# Patient Record
Sex: Female | Born: 1945 | Race: Black or African American | Hispanic: No | Smoking: Former smoker
Health system: Southern US, Community
[De-identification: ages and names within clinical notes are randomized; demographics above are authoritative.]

## PROBLEM LIST (undated history)

## (undated) DIAGNOSIS — I10 Essential (primary) hypertension: Secondary | ICD-10-CM

## (undated) HISTORY — PX: ABDOMINAL HYSTERECTOMY: SHX81

## (undated) HISTORY — PX: TOTAL ABDOMINAL HYSTERECTOMY W/ BILATERAL SALPINGOOPHORECTOMY: SHX83

---

## 2005-07-12 ENCOUNTER — Emergency Department: Payer: Self-pay | Admitting: Emergency Medicine

## 2005-07-12 ENCOUNTER — Other Ambulatory Visit: Payer: Self-pay

## 2007-12-27 ENCOUNTER — Emergency Department: Payer: Self-pay | Admitting: Emergency Medicine

## 2008-03-31 ENCOUNTER — Ambulatory Visit: Payer: Self-pay

## 2008-05-26 ENCOUNTER — Ambulatory Visit: Payer: Self-pay

## 2008-09-29 ENCOUNTER — Ambulatory Visit: Payer: Self-pay

## 2009-04-01 ENCOUNTER — Ambulatory Visit: Payer: Self-pay | Admitting: Family Medicine

## 2009-08-25 ENCOUNTER — Emergency Department: Payer: Self-pay | Admitting: Emergency Medicine

## 2010-06-01 ENCOUNTER — Ambulatory Visit: Payer: Self-pay

## 2011-06-21 ENCOUNTER — Ambulatory Visit: Payer: Self-pay

## 2012-07-02 ENCOUNTER — Ambulatory Visit: Payer: Self-pay

## 2012-07-26 ENCOUNTER — Ambulatory Visit: Payer: Self-pay | Admitting: Family Medicine

## 2013-07-14 ENCOUNTER — Emergency Department: Payer: Self-pay | Admitting: Emergency Medicine

## 2013-07-14 LAB — COMPREHENSIVE METABOLIC PANEL
ALK PHOS: 77 U/L
ANION GAP: 7 (ref 7–16)
AST: 18 U/L (ref 15–37)
Albumin: 3.6 g/dL (ref 3.4–5.0)
BILIRUBIN TOTAL: 0.4 mg/dL (ref 0.2–1.0)
BUN: 15 mg/dL (ref 7–18)
Calcium, Total: 9 mg/dL (ref 8.5–10.1)
Chloride: 107 mmol/L (ref 98–107)
Co2: 27 mmol/L (ref 21–32)
Creatinine: 0.99 mg/dL (ref 0.60–1.30)
EGFR (African American): >60
EGFR (Non-African Amer.): 60 — ABNORMAL LOW
Glucose: 123 mg/dL — ABNORMAL HIGH (ref 65–99)
Osmolality: 283 (ref 275–301)
Potassium: 3.3 mmol/L — ABNORMAL LOW (ref 3.5–5.1)
SGPT (ALT): 19 U/L (ref 12–78)
Sodium: 141 mmol/L (ref 136–145)
TOTAL PROTEIN: 7.8 g/dL (ref 6.4–8.2)

## 2013-07-14 LAB — TROPONIN I: Troponin-I: <0.02 ng/mL

## 2013-07-14 LAB — CBC
HCT: 38.3 % (ref 35.0–47.0)
HGB: 12.4 g/dL (ref 12.0–16.0)
MCH: 28.1 pg (ref 26.0–34.0)
MCHC: 32.4 g/dL (ref 32.0–36.0)
MCV: 87 fL (ref 80–100)
Platelet: 164 10*3/uL (ref 150–440)
RBC: 4.4 10*6/uL (ref 3.80–5.20)
RDW: 12.9 % (ref 11.5–14.5)
WBC: 9.2 10*3/uL (ref 3.6–11.0)

## 2013-07-14 LAB — CK TOTAL AND CKMB (NOT AT ARMC)
CK, TOTAL: 125 U/L
CK-MB: 0.6 ng/mL (ref 0.5–3.6)

## 2013-07-14 LAB — TSH: Thyroid Stimulating Horm: 1.44 u[IU]/mL

## 2013-07-23 ENCOUNTER — Ambulatory Visit: Payer: Self-pay | Admitting: Internal Medicine

## 2013-08-29 ENCOUNTER — Ambulatory Visit: Payer: Self-pay | Admitting: Gastroenterology

## 2013-10-16 ENCOUNTER — Ambulatory Visit: Payer: Self-pay | Admitting: Internal Medicine

## 2013-10-21 ENCOUNTER — Ambulatory Visit: Payer: Self-pay | Admitting: Internal Medicine

## 2014-04-29 ENCOUNTER — Ambulatory Visit: Payer: Self-pay | Admitting: Internal Medicine

## 2014-12-08 ENCOUNTER — Other Ambulatory Visit: Payer: Self-pay | Admitting: Internal Medicine

## 2014-12-08 DIAGNOSIS — R928 Other abnormal and inconclusive findings on diagnostic imaging of breast: Secondary | ICD-10-CM

## 2014-12-18 ENCOUNTER — Ambulatory Visit: Payer: Self-pay

## 2014-12-18 ENCOUNTER — Other Ambulatory Visit: Payer: Self-pay | Admitting: Internal Medicine

## 2014-12-18 ENCOUNTER — Ambulatory Visit
Admission: RE | Admit: 2014-12-18 | Discharge: 2014-12-18 | Disposition: A | Discharge status: Home / Self Care | Payer: Medicare Other | Source: Ambulatory Visit | Attending: Internal Medicine | Admitting: Internal Medicine

## 2014-12-18 ENCOUNTER — Ambulatory Visit: Payer: Medicare Other

## 2014-12-18 DIAGNOSIS — R921 Mammographic calcification found on diagnostic imaging of breast: Secondary | ICD-10-CM | POA: Insufficient documentation

## 2014-12-18 DIAGNOSIS — R928 Other abnormal and inconclusive findings on diagnostic imaging of breast: Secondary | ICD-10-CM

## 2015-12-16 ENCOUNTER — Other Ambulatory Visit: Payer: Self-pay | Admitting: Family Medicine

## 2015-12-16 DIAGNOSIS — R928 Other abnormal and inconclusive findings on diagnostic imaging of breast: Secondary | ICD-10-CM

## 2016-01-07 ENCOUNTER — Ambulatory Visit
Admission: RE | Admit: 2016-01-07 | Discharge: 2016-01-07 | Disposition: A | Discharge status: Home / Self Care | Payer: Medicare Other | Source: Ambulatory Visit | Attending: Family Medicine | Admitting: Family Medicine

## 2016-01-07 DIAGNOSIS — R921 Mammographic calcification found on diagnostic imaging of breast: Secondary | ICD-10-CM | POA: Insufficient documentation

## 2016-01-07 DIAGNOSIS — R928 Other abnormal and inconclusive findings on diagnostic imaging of breast: Secondary | ICD-10-CM

## 2016-05-17 ENCOUNTER — Other Ambulatory Visit: Payer: Self-pay | Admitting: Family Medicine

## 2016-05-17 DIAGNOSIS — Z0000 Encounter for general adult medical examination without abnormal findings: Secondary | ICD-10-CM

## 2017-01-13 ENCOUNTER — Emergency Department
Admission: EM | Admit: 2017-01-13 | Discharge: 2017-01-13 | Disposition: A | Discharge status: Home / Self Care | Payer: Medicare Other | Attending: Emergency Medicine | Admitting: Emergency Medicine

## 2017-01-13 ENCOUNTER — Encounter: Payer: Self-pay | Admitting: Emergency Medicine

## 2017-01-13 ENCOUNTER — Emergency Department: Payer: Medicare Other

## 2017-01-13 DIAGNOSIS — I10 Essential (primary) hypertension: Secondary | ICD-10-CM | POA: Diagnosis not present

## 2017-01-13 DIAGNOSIS — Z87891 Personal history of nicotine dependence: Secondary | ICD-10-CM | POA: Diagnosis not present

## 2017-01-13 DIAGNOSIS — R079 Chest pain, unspecified: Secondary | ICD-10-CM | POA: Insufficient documentation

## 2017-01-13 HISTORY — DX: Essential (primary) hypertension: I10

## 2017-01-13 LAB — COMPREHENSIVE METABOLIC PANEL
ALK PHOS: 76 U/L (ref 38–126)
ALT: 12 U/L — ABNORMAL LOW (ref 14–54)
AST: 20 U/L (ref 15–41)
Albumin: 3.7 g/dL (ref 3.5–5.0)
Anion gap: 7 (ref 5–15)
BILIRUBIN TOTAL: 0.6 mg/dL (ref 0.3–1.2)
BUN: 13 mg/dL (ref 6–20)
CALCIUM: 8.9 mg/dL (ref 8.9–10.3)
CO2: 27 mmol/L (ref 22–32)
Chloride: 102 mmol/L (ref 101–111)
Creatinine, Ser: 0.76 mg/dL (ref 0.44–1.00)
GFR calc Af Amer: >60 mL/min (ref >60)
GFR calc non Af Amer: >60 mL/min (ref >60)
Glucose, Bld: 120 mg/dL — ABNORMAL HIGH (ref 65–99)
Potassium: 3.6 mmol/L (ref 3.5–5.1)
Sodium: 136 mmol/L (ref 135–145)
TOTAL PROTEIN: 7.8 g/dL (ref 6.5–8.1)

## 2017-01-13 LAB — CBC
HCT: 40.4 % (ref 35.0–47.0)
Hemoglobin: 14 g/dL (ref 12.0–16.0)
MCH: 29.7 pg (ref 26.0–34.0)
MCHC: 34.7 g/dL (ref 32.0–36.0)
MCV: 85.6 fL (ref 80.0–100.0)
Platelets: 196 10*3/uL (ref 150–440)
RBC: 4.72 MIL/uL (ref 3.80–5.20)
RDW: 13 % (ref 11.5–14.5)
WBC: 8.6 10*3/uL (ref 3.6–11.0)

## 2017-01-13 LAB — TROPONIN I: Troponin I: <0.03 ng/mL (ref <0.03)

## 2017-01-13 NOTE — ED Notes (Signed)
Introduced myself to the pt after taking report from [place]. Pt is currently pain free and awaiting lab results. Sitting comfortably in bed watching tv in NAD

## 2017-01-13 NOTE — Discharge Instructions (Signed)
You have been seen in the emergency department today for chest pain. Your workup has shown normal results. As we discussed please follow-up with your primary care physician in the next 1-2 days for recheck. Return to the emergency department for any further chest pain, trouble breathing, or any other symptom personally concerning to yourself. °

## 2017-01-13 NOTE — ED Notes (Signed)
Informed patient that the next doctor that comes in 69 would be seeing her. Informed her that blood results and X results should be back soon. Pt agreeable with plan. Given TV remote.

## 2017-01-13 NOTE — ED Provider Notes (Signed)
[facility] Emergency Department Provider Note  Time seen: 11:08 AM  I have reviewed the triage vital signs and the nursing notes.   HISTORY  Chief Complaint Chest Pain    HPI TIFFONY KITE is a 71 y.o. female with a past medical history of hypertension who presents to the emergency department for chest pain. According to the patient she awoke this morning feeling fine. She ate breakfast and developed a discomfort in the chest.patient states it felt like indigestion so she did not pay much attention to it. She went to work states the chest pain had went away but she was having a strange sensation like her blood pressure was up or like her sugar was up so she came to the emergency department. Patient denies any symptoms at this time. States she is feeling normal currently. Denies any further chest discomfort. Denies any shortness of breath nausea or diaphoresis at any time. No leg pain or swelling.  Past Medical History:  Diagnosis Date  . Hypertension     There are no active problems to display for this patient.   Past Surgical History:  Procedure Laterality Date  . ABDOMINAL HYSTERECTOMY      Prior to Admission medications   Not on File    Allergies  Allergen Reactions  . Aspirin Other (See Comments)    Heart flutter    Family History  Problem Relation Age of Onset  . Breast cancer Maternal Aunt 70    Social History Social History  Substance Use Topics  . Smoking status: Former Research scientist (life sciences)  . Smokeless tobacco: Not on file  . Alcohol use Not on file    Review of Systems Constitutional: Negative for fever Cardiovascular: positive for chest discomfort, now resolved. Respiratory: Negative for shortness of breath. Gastrointestinal: Negative for abdominal pain, vomiting and diarrhea. Genitourinary: Negative for dysuria. Musculoskeletal: negative for leg pain or swelling. Neurological: Negative for headache All other ROS  negative  ____________________________________________   PHYSICAL EXAM:  VITAL SIGNS: ED Triage Vitals  Enc Vitals Group     BP 01/13/17 1022 (!) 170/90     Pulse Rate 01/13/17 1022 99     Resp 01/13/17 1022 20     Temp 01/13/17 1022 98.8 F (37.1 C)     Temp Source 01/13/17 1022 Oral     SpO2 01/13/17 1022 100 %     Weight 01/13/17 1025 118 lb (53.5 kg)     Height 01/13/17 1025 5\' 4"  (1.626 m)     Head Circumference --      Peak Flow --      Pain Score 01/13/17 1050 0     Pain Loc --      Pain Edu? --      Excl. in [place]? --     Constitutional: Alert and oriented. Well appearing and in no distress. Eyes: Normal exam ENT   Head: Normocephalic and atraumatic   Mouth/Throat: Mucous membranes are moist. Cardiovascular: Normal rate, regular rhythm. No murmur Respiratory: Normal respiratory effort without tachypnea nor retractions. Breath sounds are clear Gastrointestinal: Soft and nontender. No distention. Musculoskeletal: Nontender with normal range of motion in all extremities. no leg pain or swelling. Neurologic:  Normal speech and language. No gross focal neurologic deficits Skin:  Skin is warm, dry and intact.  Psychiatric: Mood and affect are normal.   ____________________________________________    EKG  EKG reviewed and interpreted by myself shows normal sinus rhythm at 94 bpm, narrow QRS, normal axis, normal  intervals, non-concerning ST changes.  ____________________________________________    RADIOLOGY  chest x-ray negative  ____________________________________________   INITIAL IMPRESSION / ASSESSMENT AND PLAN / ED COURSE  Pertinent labs & imaging results that were available during my care of the patient were reviewed by me and considered in my medical decision making (see chart for details).  patient presents to the emergency department for chest discomfort this morning after eating. Patient states it felt like indigestion, but later at work she  had a strange sensation like her blood pressure might be up or like her sugar might be up so she decided to come to the emergency department for evaluation. Denies any chest discomfort currently. Denies any shortness of breath nausea or diaphoresis at any point. Physical exam is nonrevealing, no lower extremity pain or edema. Normal heart sounds and clear lung sounds. Patient's EKG is reassuring, chest x-ray is negative. Labs are pending at this time.  patient's labs are normal including negative troponin. Patient continues to appear very well with no complaints in the emergency department. We will discharge home with my normal chest pain return precautions and PCP follow-up. Patient agreeable to plan.  ____________________________________________   FINAL CLINICAL IMPRESSION(S) / ED DIAGNOSES  chest pain    Harvest Dark, MD 01/13/17 1149

## 2017-01-13 NOTE — ED Notes (Addendum)
Report to Susan RN.

## 2017-01-13 NOTE — ED Triage Notes (Signed)
Woke with chest pain this am, resolved. Returned while she was at work, pain free at present.

## 2017-02-12 ENCOUNTER — Other Ambulatory Visit: Payer: Self-pay | Admitting: Family Medicine

## 2017-02-12 DIAGNOSIS — Z1231 Encounter for screening mammogram for malignant neoplasm of breast: Secondary | ICD-10-CM

## 2017-03-01 ENCOUNTER — Ambulatory Visit
Admission: RE | Admit: 2017-03-01 | Discharge: 2017-03-01 | Disposition: A | Discharge status: Home / Self Care | Payer: Medicare Other | Source: Ambulatory Visit | Attending: Family Medicine | Admitting: Family Medicine

## 2017-03-01 DIAGNOSIS — Z1231 Encounter for screening mammogram for malignant neoplasm of breast: Secondary | ICD-10-CM | POA: Insufficient documentation

## 2018-02-13 ENCOUNTER — Emergency Department
Admission: EM | Admit: 2018-02-13 | Discharge: 2018-02-14 | Disposition: A | Discharge status: Home / Self Care | Payer: Medicare Other | Attending: Emergency Medicine | Admitting: Emergency Medicine

## 2018-02-13 ENCOUNTER — Other Ambulatory Visit: Payer: Self-pay

## 2018-02-13 ENCOUNTER — Encounter: Payer: Self-pay | Admitting: Emergency Medicine

## 2018-02-13 ENCOUNTER — Emergency Department: Payer: Medicare Other

## 2018-02-13 DIAGNOSIS — Z87891 Personal history of nicotine dependence: Secondary | ICD-10-CM | POA: Diagnosis not present

## 2018-02-13 DIAGNOSIS — M545 Low back pain, unspecified: Secondary | ICD-10-CM

## 2018-02-13 DIAGNOSIS — I10 Essential (primary) hypertension: Secondary | ICD-10-CM | POA: Insufficient documentation

## 2018-02-13 DIAGNOSIS — R1031 Right lower quadrant pain: Secondary | ICD-10-CM

## 2018-02-13 DIAGNOSIS — R109 Unspecified abdominal pain: Secondary | ICD-10-CM

## 2018-02-13 DIAGNOSIS — N390 Urinary tract infection, site not specified: Secondary | ICD-10-CM | POA: Diagnosis not present

## 2018-02-13 LAB — CBC
HCT: 36.3 % (ref 35.0–47.0)
Hemoglobin: 13.1 g/dL (ref 12.0–16.0)
MCH: 31.6 pg (ref 26.0–34.0)
MCHC: 36 g/dL (ref 32.0–36.0)
MCV: 87.7 fL (ref 80.0–100.0)
PLATELETS: 185 10*3/uL (ref 150–440)
RBC: 4.14 MIL/uL (ref 3.80–5.20)
RDW: 13 % (ref 11.5–14.5)
WBC: 6.4 10*3/uL (ref 3.6–11.0)

## 2018-02-13 LAB — URINALYSIS, COMPLETE (UACMP) WITH MICROSCOPIC
BILIRUBIN URINE: NEGATIVE
Bacteria, UA: NONE SEEN
Glucose, UA: NEGATIVE mg/dL
HGB URINE DIPSTICK: NEGATIVE
Ketones, ur: NEGATIVE mg/dL
Nitrite: NEGATIVE
Protein, ur: NEGATIVE mg/dL
Specific Gravity, Urine: 1.005 (ref 1.005–1.030)
pH: 6 (ref 5.0–8.0)

## 2018-02-13 LAB — COMPREHENSIVE METABOLIC PANEL
ALK PHOS: 61 U/L (ref 38–126)
ALT: 11 U/L (ref 0–44)
ANION GAP: 7 (ref 5–15)
AST: 17 U/L (ref 15–41)
Albumin: 4 g/dL (ref 3.5–5.0)
BUN: 11 mg/dL (ref 8–23)
CHLORIDE: 104 mmol/L (ref 98–111)
CO2: 29 mmol/L (ref 22–32)
Calcium: 8.7 mg/dL — ABNORMAL LOW (ref 8.9–10.3)
Creatinine, Ser: 0.69 mg/dL (ref 0.44–1.00)
GFR calc Af Amer: >60 mL/min (ref >60)
GFR calc non Af Amer: >60 mL/min (ref >60)
Glucose, Bld: 125 mg/dL — ABNORMAL HIGH (ref 70–99)
Potassium: 3.4 mmol/L — ABNORMAL LOW (ref 3.5–5.1)
Sodium: 140 mmol/L (ref 135–145)
Total Bilirubin: 0.7 mg/dL (ref 0.3–1.2)
Total Protein: 7.2 g/dL (ref 6.5–8.1)

## 2018-02-13 NOTE — ED Provider Notes (Signed)
[facility] Emergency Department Provider Note   ____________________________________________   First MD Initiated Contact with Patient 02/13/18 2301     (approximate)  I have reviewed the triage vital signs and the nursing notes.   HISTORY  Chief Complaint Flank Pain    HPI Patricia Bolton is a 72 y.o. female who presents to the ED from home with a chief complaint of right flank pain.  Patient reports symptoms for 1 week.  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  Now pain is radiating to her right lower quadrant.  Denies associated fever, chills, chest pain, shortness of breath, nausea, vomiting, dysuria.  Denies recent travel or trauma.   Past Medical History:  Diagnosis Date  . Hypertension     There are no active problems to display for this patient.   Past Surgical History:  Procedure Laterality Date  . ABDOMINAL HYSTERECTOMY      Prior to Admission medications   Medication Sig Start Date End Date Taking? Authorizing Provider  cyclobenzaprine (FLEXERIL) 5 MG tablet 1 tablet every 8 hours as needed for muscle spasms 02/14/18   Paulette Blanch, MD  HYDROcodone-acetaminophen (NORCO) 5-325 MG tablet Take 1 tablet by mouth every 6 (six) hours as needed for moderate pain. 02/14/18   Paulette Blanch, MD    Allergies Aspirin  Family History  Problem Relation Age of Onset  . Breast cancer Maternal Aunt 70    Social History Social History   Tobacco Use  . Smoking status: Former Research scientist (life sciences)  . Smokeless tobacco: Never Used  Substance Use Topics  . Alcohol use: Never    Frequency: Never  . Drug use: Never    Review of Systems  Constitutional: No fever/chills Eyes: No visual changes. ENT: No sore throat. Cardiovascular: Denies chest pain. Respiratory: Denies shortness of breath. Gastrointestinal: Positive for right flank and abdominal pain.  No nausea, no vomiting.  No diarrhea.  No constipation. Genitourinary:  Negative for dysuria. Musculoskeletal: Negative for back pain. Skin: Negative for rash. Neurological: Negative for headaches, focal weakness or numbness.   ____________________________________________   PHYSICAL EXAM:  VITAL SIGNS: ED Triage Vitals [02/13/18 2015]  Enc Vitals Group     BP (!) 163/82     Pulse Rate 86     Resp 18     Temp 98.2 F (36.8 C)     Temp Source Oral     SpO2 100 %     Weight 120 lb (54.4 kg)     Height 5\' 3"  (1.6 m)     Head Circumference      Peak Flow      Pain Score 9     Pain Loc      Pain Edu?      Excl. in [place]?     Constitutional: Alert and oriented. Well appearing and in no acute distress. Eyes: Conjunctivae are normal. PERRL. EOMI. Head: Atraumatic. Nose: No congestion/rhinnorhea. Mouth/Throat: Mucous membranes are moist.  Oropharynx non-erythematous. Neck: No stridor.   Cardiovascular: Normal rate, regular rhythm. Grossly normal heart sounds.  Good peripheral circulation. Respiratory: Normal respiratory effort.  No retractions. Lungs CTAB. Gastrointestinal: Soft and nontender to light or deep palpation. No distention. No abdominal bruits. No CVA tenderness. Musculoskeletal: No spinal tenderness to palpation.  Mild right paralumbar muscle spasms.  No lower extremity tenderness nor edema.  No joint effusions. Neurologic:  Normal speech and language. No gross focal neurologic deficits are appreciated. No gait  instability. Skin:  Skin is warm, dry and intact. No rash noted.  No vesicles. Psychiatric: Mood and affect are normal. Speech and behavior are normal.  ____________________________________________   LABS (all labs ordered are listed, but only abnormal results are displayed)  Labs Reviewed  COMPREHENSIVE METABOLIC PANEL - Abnormal; Notable for the following components:      Result Value   Potassium 3.4 (*)    Glucose, Bld 125 (*)    Calcium 8.7 (*)    All other components within normal limits  URINALYSIS, COMPLETE (UACMP)  WITH MICROSCOPIC - Abnormal; Notable for the following components:   Color, Urine STRAW (*)    APPearance CLEAR (*)    Leukocytes, UA TRACE (*)    All other components within normal limits  CBC   ____________________________________________  EKG  None ____________________________________________  RADIOLOGY  ED MD interpretation: No stones, diverticulosis, likely chronic mesenteric edema with lymphadenopathy  Official radiology report(s): Ct Renal Stone Study  Result Date: 02/13/2018 CLINICAL DATA:  Right flank pain. EXAM: CT ABDOMEN AND PELVIS WITHOUT CONTRAST TECHNIQUE: Multidetector CT imaging of the abdomen and pelvis was performed following the standard protocol without IV contrast. COMPARISON:  None. FINDINGS: Lower chest: The lung bases are clear. Hepatobiliary: Focal hepatic abnormality on noncontrast exam. Gallbladder is nondistended. No calcified gallstone or pericholecystic inflammation. No biliary dilatation. Pancreas: No ductal dilatation or inflammation. Lack of contrast and paucity of intra-abdominal fat partially limits evaluation of the pancreas. Spleen: Subdiaphragmatic capsular calcifications peripherally. Spleen is normal in size. Adrenals/Urinary Tract: Mild bilateral adrenal thickening. No dominant adrenal nodule. No hydronephrosis or perinephric edema. 5 mm calcification in the left kidney felt to be dystrophic, no definite stones in the collecting system. Both ureters are decompressed. Urinary bladder is partially distended, equivocal wall thickening which may be normal for degree of distension. No bladder stone. Stomach/Bowel: Multifocal colonic diverticulosis without diverticulitis. Diverticula most prominent in the distal colon. Possible noninflamed diverticulum in the distal ileum, for example axial image 54 series 2. The appendix is not discretely identified, no secondary findings of appendicitis. Small hiatal hernia and wall thickening of the gastric cardia. No bowel  obstruction, small bowel inflammation or evidence of wall thickening on noncontrast exam. Vascular/Lymphatic: Mild aorta bi-iliac atherosclerosis. Mild central mesenteric edema with adjacent small mesenteric nodes. No bulky adenopathy. Reproductive: Status post hysterectomy. No adnexal masses. Other: Mild central mesenteric edema. No free fluid or free air. No intra-abdominal abscess. Musculoskeletal: There are no acute or suspicious osseous abnormalities. IMPRESSION: 1. No renal stones or obstructive uropathy. Coarse calcification in the left kidney felt to be parenchymal and likely dystrophic. 2. Mild central mesenteric edema with prominent lymph nodes, of uncertain acuity. This is often a chronic finding, however acute mesenteric panniculitis is also considered. No suspicious small-bowel abnormality on noncontrast exam. 3. Diffuse colonic diverticulosis without diverticulitis. Possible mild distal small bowel diverticulosis, also noninflamed. 4.  Aortic Atherosclerosis (ICD10-I70.0). Electronically Signed   By: Keith Rake M.D.   On: 02/13/2018 23:51    ____________________________________________   PROCEDURES  Procedure(s) performed: None  Procedures  Critical Care performed: No  ____________________________________________   INITIAL IMPRESSION / ASSESSMENT AND PLAN / ED COURSE  As part of my medical decision making, I reviewed the following data within the [place] notes reviewed and incorporated, Labs reviewed, Old chart reviewed, Radiograph reviewed  and Notes from prior ED visits   72 year old female who presents with a one-week history of right flank to abdominal pain. Differential diagnosis includes,  but is not limited to, ovarian cyst, ovarian torsion, acute appendicitis, diverticulitis, urinary tract infection/pyelonephritis, endometriosis, bowel obstruction, colitis, renal colic, gastroenteritis, hernia, fibroids, endometriosis, etc.  Endorses  pain currently 1/10.  Laboratory and urinalysis results notable for trace leukocytes.  Patient has no personal history of kidney stones.  Will obtain CT renal colic study to evaluate for stones.  Clinical Course as of Feb 14 30  Thu Feb 14, 2018  0027 Patient resting in no acute distress.  Updated her of CT results.  Will give 1 dose fosfomycin and patient will follow-up with her PCP.  Will discharge home on analgesia and muscle relaxer.  Hold NSAIDs as patient is allergic to aspirin.  Strict return precautions given.  Patient verbalizes understanding and agrees with plan of care.   [JS]    Clinical Course User Index [JS] Paulette Blanch, MD     ____________________________________________   FINAL CLINICAL IMPRESSION(S) / ED DIAGNOSES  Final diagnoses:  Right flank pain  Lumbosacral pain  Lower urinary tract infectious disease  Right lower quadrant abdominal pain     ED Discharge Orders         Ordered    cyclobenzaprine (FLEXERIL) 5 MG tablet     02/14/18 0029    HYDROcodone-acetaminophen (NORCO) 5-325 MG tablet  Every 6 hours PRN     02/14/18 0029           Note:  This document was prepared using Dragon voice recognition software and may include unintentional dictation errors.    Paulette Blanch, MD 02/14/18 [phone]

## 2018-02-13 NOTE — ED Triage Notes (Signed)
Pt presents to ED with right sided back / flank pain for the past week. Pt denies radiating pain or urinary symptoms. Pt denies similar symptoms previously.

## 2018-02-14 MED ORDER — HYDROCODONE-ACETAMINOPHEN 5-325 MG PO TABS: 1.0000 | ORAL_TABLET | Freq: Four times a day (QID) | ORAL | 0 refills | Status: DC | PRN

## 2018-02-14 MED ORDER — CYCLOBENZAPRINE HCL 5 MG PO TABS: ORAL_TABLET | ORAL | 0 refills | Status: DC

## 2018-02-14 MED ORDER — FOSFOMYCIN TROMETHAMINE 3 G PO PACK
3.0000 g | PACK | Freq: Once | ORAL | Status: AC
  Administered 2018-02-14: 3 g via ORAL
  Filled 2018-02-14: qty 3

## 2018-02-14 NOTE — Discharge Instructions (Signed)
1.  You may take medicines as needed for pain and muscle spasms (Norco/Flexeril #15). 2.  Apply moist heat to affected area several times daily. 3.  Return to the ER for worsening symptoms, persistent vomiting, difficulty breathing or other concerns.

## 2018-02-18 ENCOUNTER — Other Ambulatory Visit (HOSPITAL_COMMUNITY): Payer: Self-pay | Admitting: Diagnostic Radiology

## 2018-02-18 ENCOUNTER — Other Ambulatory Visit: Payer: Self-pay | Admitting: Family Medicine

## 2018-02-18 DIAGNOSIS — Z1231 Encounter for screening mammogram for malignant neoplasm of breast: Secondary | ICD-10-CM

## 2018-07-24 ENCOUNTER — Telehealth: Payer: Self-pay | Admitting: *Deleted

## 2018-07-24 NOTE — Telephone Encounter (Signed)
Received referral for low dose lung cancer screening CT scan. Attempted to leave Message at phone number listed in EMR for patient to call me back to facilitate scheduling scan. However, this option is not available. Will attempt to contact at a later date.

## 2018-07-25 ENCOUNTER — Telehealth: Payer: Self-pay | Admitting: *Deleted

## 2018-07-25 NOTE — Telephone Encounter (Signed)
Received referral for low dose lung cancer screening CT scan. Attempted to leave Message at phone number listed in EMR for patient to call me back to facilitate scheduling scan. However, this option is not available. Will attempt to contact at a later date

## 2018-07-26 ENCOUNTER — Telehealth: Payer: Self-pay | Admitting: *Deleted

## 2018-07-26 ENCOUNTER — Encounter: Payer: Self-pay | Admitting: *Deleted

## 2018-07-26 NOTE — Telephone Encounter (Signed)
Continue to be unable to contact patient regarding lung cancer screening referral. Will mail letter.

## 2018-10-03 ENCOUNTER — Telehealth: Payer: Self-pay | Admitting: *Deleted

## 2018-10-03 NOTE — Telephone Encounter (Signed)
Attempted to schedule for lung screening scan, however, there is no answer or voicemail.

## 2018-10-15 ENCOUNTER — Telehealth: Payer: Self-pay | Admitting: *Deleted

## 2018-10-15 ENCOUNTER — Encounter: Payer: Self-pay | Admitting: *Deleted

## 2018-10-15 NOTE — Telephone Encounter (Signed)
Received referral for low dose lung cancer screening CT scan. Message left at phone number listed in EMR for patient to call me back to facilitate scheduling scan.  

## 2018-10-25 ENCOUNTER — Encounter: Payer: Self-pay | Admitting: *Deleted

## 2019-01-25 ENCOUNTER — Emergency Department
Admission: EM | Admit: 2019-01-25 | Discharge: 2019-01-26 | Disposition: A | Discharge status: Home / Self Care | Payer: Medicare Other | Attending: Emergency Medicine | Admitting: Emergency Medicine

## 2019-01-25 ENCOUNTER — Other Ambulatory Visit: Payer: Self-pay

## 2019-01-25 DIAGNOSIS — I10 Essential (primary) hypertension: Secondary | ICD-10-CM | POA: Diagnosis not present

## 2019-01-25 DIAGNOSIS — R002 Palpitations: Secondary | ICD-10-CM | POA: Insufficient documentation

## 2019-01-25 DIAGNOSIS — Z87891 Personal history of nicotine dependence: Secondary | ICD-10-CM | POA: Diagnosis not present

## 2019-01-25 LAB — BASIC METABOLIC PANEL
Anion gap: 8 (ref 5–15)
BUN: 14 mg/dL (ref 8–23)
CO2: 27 mmol/L (ref 22–32)
Calcium: 8.7 mg/dL — ABNORMAL LOW (ref 8.9–10.3)
Chloride: 104 mmol/L (ref 98–111)
Creatinine, Ser: 0.75 mg/dL (ref 0.44–1.00)
GFR calc Af Amer: >60 mL/min (ref >60)
GFR calc non Af Amer: >60 mL/min (ref >60)
Glucose, Bld: 92 mg/dL (ref 70–99)
Potassium: 3.6 mmol/L (ref 3.5–5.1)
Sodium: 139 mmol/L (ref 135–145)

## 2019-01-25 LAB — MAGNESIUM: Magnesium: 2.2 mg/dL (ref 1.7–2.4)

## 2019-01-25 LAB — TSH: TSH: 1.269 u[IU]/mL (ref 0.350–4.500)

## 2019-01-25 LAB — CBC
HCT: 38.1 % (ref 36.0–46.0)
Hemoglobin: 13 g/dL (ref 12.0–15.0)
MCH: 30 pg (ref 26.0–34.0)
MCHC: 34.1 g/dL (ref 30.0–36.0)
MCV: 88 fL (ref 80.0–100.0)
Platelets: 186 10*3/uL (ref 150–400)
RBC: 4.33 MIL/uL (ref 3.87–5.11)
RDW: 12.1 % (ref 11.5–15.5)
WBC: 7.6 10*3/uL (ref 4.0–10.5)
nRBC: 0 % (ref 0.0–0.2)

## 2019-01-25 NOTE — ED Triage Notes (Signed)
Pt comes EMS from home after experiencing some palpitations while getting ready. Pt says her heart was racing. Pt was hypertensive with EMS and 158/94 now. Pt AOx4. Pt was on BP meds a couple of years ago but stopped taking them. 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

## 2019-01-25 NOTE — ED Notes (Signed)
Pt unhooked to use restroom. Pt resting back in bed in NAD. Pt denies any further needs. No reports of palpitations since arrival.

## 2019-01-26 NOTE — ED Notes (Signed)
Pt signed physical discharge form. 

## 2019-01-26 NOTE — ED Provider Notes (Signed)
[facility] Emergency Department Provider Note   ____________________________________________   First MD Initiated Contact with Patient 01/25/19 2131     (approximate)  I have reviewed the triage vital signs and the nursing notes.   HISTORY  Chief Complaint Palpitations    HPI Patricia Bolton is a 73 y.o. female reports history of hypertension and previously told she may have had a "heart arrhythmia"  Patient reports she was at home, she was getting things ready to go to church tomorrow when she noticed her heart started beating fast.  It felt like it was racing for about 30 minutes.  Was not associate any chest pain or shortness of breath.  It just seemed to persist, she comes the ER for further evaluation.  She reports that this has since gone away on its own.  She is in no pain or discomfort.  Denies having had any chest pain.  She reports that she is previously been followed up at her primary doctor as she had a similar episode in the past as well.  No abdominal pain.  No recent illness.  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  Denies any ongoing symptoms at all.  Currently asymptomatic.  She did not feel like she was in a pass out just felt her heart was racing rather quickly for about half hour   Past Medical History:  Diagnosis Date  . Hypertension     There are no active problems to display for this patient.   Past Surgical History:  Procedure Laterality Date  . ABDOMINAL HYSTERECTOMY      Prior to Admission medications   Medication Sig Start Date End Date Taking? Authorizing Provider  cyclobenzaprine (FLEXERIL) 5 MG tablet 1 tablet every 8 hours as needed for muscle spasms 02/14/18   Paulette Blanch, MD  HYDROcodone-acetaminophen (NORCO) 5-325 MG tablet Take 1 tablet by mouth every 6 (six) hours as needed for moderate pain. 02/14/18   Paulette Blanch, MD    Allergies Aspirin  Family History  Problem Relation  Age of Onset  . Breast cancer Maternal Aunt 70    Social History Social History   Tobacco Use  . Smoking status: Former Research scientist (life sciences)  . Smokeless tobacco: Never Used  Substance Use Topics  . Alcohol use: Never    Frequency: Never  . Drug use: Never    Review of Systems Constitutional: No fever/chills Eyes: No visual changes. ENT: No sore throat. Cardiovascular: Denies chest pain.  See HPI respiratory: Denies shortness of breath. Gastrointestinal: No abdominal pain.   Genitourinary: Negative for dysuria. Musculoskeletal: Negative for back pain. Skin: Negative for rash. Neurological: Negative for headaches, areas of focal weakness or numbness.   Of note, patient was documented as possibly having a heart aneurysm, but in discussion with her which he tells me is that she had an arrhythmia not aneurysm ____________________________________________   PHYSICAL EXAM:  VITAL SIGNS: ED Triage Vitals [01/25/19 2052]  Enc Vitals Group     BP (!) 158/94     Pulse Rate 75     Resp 17     Temp 97.9 F (36.6 C)     Temp Source Oral     SpO2 100 %     Weight 118 lb (53.5 kg)     Height 5\' 6"  (1.676 m)     Head Circumference      Peak Flow      Pain Score 0  Pain Loc      Pain Edu?      Excl. in [place]?     Constitutional: Alert and oriented. Well appearing and in no acute distress. Head: Atraumatic. Mouth/Throat: Mucous membranes are moist. Neck: No stridor.  Cardiovascular: Normal rate, regular rhythm. Grossly normal heart sounds.  Good peripheral circulation.  Very reassuring and normal cardiopulmonary exam. Respiratory: Normal respiratory effort.  No retractions. Lungs CTAB. Gastrointestinal: Soft and nontender.  Musculoskeletal: No lower extremity tenderness nor edema. Neurologic:  Normal speech and language. No gross focal neurologic deficits are appreciated.  Skin:  Skin is warm, dry and intact. No rash noted. Psychiatric: Mood and affect are normal.  Very pleasant and  well oriented  ____________________________________________   LABS (all labs ordered are listed, but only abnormal results are displayed)  Labs Reviewed  BASIC METABOLIC PANEL - Abnormal; Notable for the following components:      Result Value   Calcium 8.7 (*)    All other components within normal limits  CBC  MAGNESIUM  TSH   ____________________________________________  EKG  ED ECG REPORT I, Delman Kitten, the attending physician, personally viewed and interpreted this ECG.  Date: 01/26/2019 EKG Time: 2100 Rate: 75 Rhythm: normal sinus rhythm QRS Axis: normal Intervals: normal ST/T Wave abnormalities: normal Narrative Interpretation: no evidence of acute ischemia  ____________________________________________  RADIOLOGY  No indication for imaging denoted.  No acute pulmonary symptoms.  Denies chest pain. ____________________________________________   PROCEDURES  Procedure(s) performed: None  Procedures  Critical Care performed: No  ____________________________________________   INITIAL IMPRESSION / ASSESSMENT AND PLAN / ED COURSE  Pertinent labs & imaging results that were available during my care of the patient were reviewed by me and considered in my medical decision making (see chart for details).   Patient had palpitations, currently resolved.  Reports she has had this once in the past at least.  Symptoms were landing on her own and she did not feel presyncopal or have any chest pain or shortness of breath associated.  She is currently asymptomatic.  EKG and lab work reassuring.  No chest pain, I do not suspect ACS.  Clear lungs and reassuring vitals with moderate hypertension which she reports is no  Lab work reviewed, electrolytes reassuring, slightly low calcium.  TSH reviewed.  CBC normal.  Patient remained asymptomatic throughout her ER course, discussed with her and she is comfortable with careful return precautions and will set up a follow-up with  her primary doctor and cardiology for consideration for possible heart monitoring.  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  Denies heavy caffeine intake.  Denies any stimulant or drug use.  Return precautions and treatment recommendations and follow-up discussed with the patient who is agreeable with the plan.  No COVID-like symptoms ROZAN TATEISHI was evaluated in Emergency Department on 01/26/2019 for the symptoms described in the history of present illness. She was evaluated in the context of the global COVID-19 pandemic, which necessitated consideration that the patient might be at risk for infection with the SARS-CoV-2 virus that causes COVID-19. Institutional protocols and algorithms that pertain to the evaluation of patients at risk for COVID-19 are in a state of rapid change based on information released by regulatory bodies including the CDC and federal and state organizations. These policies and algorithms were followed during the patient's care in the  ED.       ____________________________________________   FINAL CLINICAL IMPRESSION(S) / ED DIAGNOSES  Final diagnoses:  Palpitations        Note:  This document was prepared using Dragon voice recognition software and may include unintentional dictation errors       Delman Kitten, MD 01/26/19 0127

## 2019-04-25 ENCOUNTER — Other Ambulatory Visit: Payer: Self-pay | Admitting: Student

## 2019-04-25 DIAGNOSIS — Z1231 Encounter for screening mammogram for malignant neoplasm of breast: Secondary | ICD-10-CM

## 2019-05-21 ENCOUNTER — Emergency Department
Admission: EM | Admit: 2019-05-21 | Discharge: 2019-05-21 | Disposition: A | Discharge status: Home / Self Care | Payer: Medicare Other | Attending: Emergency Medicine | Admitting: Emergency Medicine

## 2019-05-21 ENCOUNTER — Encounter: Payer: Self-pay | Admitting: Emergency Medicine

## 2019-05-21 ENCOUNTER — Other Ambulatory Visit: Payer: Self-pay

## 2019-05-21 DIAGNOSIS — Z87891 Personal history of nicotine dependence: Secondary | ICD-10-CM | POA: Insufficient documentation

## 2019-05-21 DIAGNOSIS — H5789 Other specified disorders of eye and adnexa: Secondary | ICD-10-CM | POA: Diagnosis present

## 2019-05-21 DIAGNOSIS — H109 Unspecified conjunctivitis: Secondary | ICD-10-CM | POA: Diagnosis not present

## 2019-05-21 DIAGNOSIS — I10 Essential (primary) hypertension: Secondary | ICD-10-CM | POA: Insufficient documentation

## 2019-05-21 MED ORDER — SULFACETAMIDE SODIUM 10 % OP SOLN: 2.0000 [drp] | Freq: Four times a day (QID) | OPHTHALMIC | 0 refills | Status: AC

## 2019-05-21 MED ORDER — OLOPATADINE HCL 0.1 % OP SOLN: 1.0000 [drp] | Freq: Two times a day (BID) | OPHTHALMIC | 12 refills | Status: DC

## 2019-05-21 NOTE — ED Triage Notes (Signed)
Pt reports redness, draining and itching to her right eye for the past 2 days.

## 2019-05-21 NOTE — ED Provider Notes (Signed)
[facility] Emergency Department Provider Note   ____________________________________________   First MD Initiated Contact with Patient 05/21/19 1114     (approximate)  I have reviewed the triage vital signs and the nursing notes.   HISTORY  Chief Complaint Eye Drainage and Conjunctivitis    HPI Patricia Bolton is a 74 y.o. female patient complains of 2 days of purulent drainage from the right eye.  She states this morning she noticed the left ankle infected.  Patient also with itching associated with complaint.  Patient denies vision loss.  Patient denies pain.         Past Medical History:  Diagnosis Date  . Hypertension     There are no problems to display for this patient.   Past Surgical History:  Procedure Laterality Date  . ABDOMINAL HYSTERECTOMY      Prior to Admission medications   Medication Sig Start Date End Date Taking? Authorizing Provider  cyclobenzaprine (FLEXERIL) 5 MG tablet 1 tablet every 8 hours as needed for muscle spasms 02/14/18   Paulette Blanch, MD  HYDROcodone-acetaminophen (NORCO) 5-325 MG tablet Take 1 tablet by mouth every 6 (six) hours as needed for moderate pain. 02/14/18   Paulette Blanch, MD  olopatadine (PATADAY) 0.1 % ophthalmic solution Place 1 drop into both eyes 2 (two) times daily. 05/21/19   Sable Feil, PA-C  sulfacetamide (BLEPH-10) 10 % ophthalmic solution Place 2 drops into both eyes 4 (four) times daily for 10 days. 05/21/19 05/31/19  Sable Feil, PA-C    Allergies Aspirin  Family History  Problem Relation Age of Onset  . Breast cancer Maternal Aunt 70    Social History Social History   Tobacco Use  . Smoking status: Former Research scientist (life sciences)  . Smokeless tobacco: Never Used  Substance Use Topics  . Alcohol use: Never  . Drug use: Never    Review of Systems Constitutional: No fever/chills Eyes: No visual changes.  Purulent drainage right eye.  Bilateral erythematous conjunctiva. ENT: No sore  throat. Cardiovascular: Denies chest pain. Respiratory: Denies shortness of breath. Gastrointestinal: No abdominal pain.  No nausea, no vomiting.  No diarrhea.  No constipation. Genitourinary: Negative for dysuria. Musculoskeletal: Negative for back pain. Skin: Negative for rash. Neurological: Negative for headaches, focal weakness or numbness. Allergic/Immunilogical: Aspirin ____________________________________________   PHYSICAL EXAM:  VITAL SIGNS: ED Triage Vitals  Enc Vitals Group     BP 05/21/19 1005 (!) 156/90     Pulse Rate 05/21/19 1005 94     Resp 05/21/19 1005 18     Temp 05/21/19 1005 98.5 F (36.9 C)     Temp Source 05/21/19 1005 Oral     SpO2 05/21/19 1005 100 %     Weight 05/21/19 0956 114 lb (51.7 kg)     Height 05/21/19 0956 5\' 2"  (1.575 m)     Head Circumference --      Peak Flow --      Pain Score 05/21/19 0956 0     Pain Loc --      Pain Edu? --      Excl. in [place]? --    Constitutional: Alert and oriented. Well appearing and in no acute distress. Eyes: Bilateral conjunctivitis.   PERRL. EOMI. purulent drainage right eye. Neck: No stridor.   Hematological/Lymphatic/Immunilogical: No cervical lymphadenopathy. Cardiovascular: Normal rate, regular rhythm. Grossly normal heart sounds.  Good peripheral circulation.  Elevated blood pressure Respiratory: Normal respiratory effort.  No retractions. Lungs CTAB. Neurologic:  Normal speech and language. No gross focal neurologic deficits are appreciated. No gait instability. Skin:  Skin is warm, dry and intact. No rash noted. Psychiatric: Mood and affect are normal. Speech and behavior are normal.  ____________________________________________   LABS (all labs ordered are listed, but only abnormal results are displayed)  Labs Reviewed - No data to display ____________________________________________  EKG   ____________________________________________  RADIOLOGY  ED MD interpretation:    Official  radiology report(s): No results found.  ____________________________________________   PROCEDURES  Procedure(s) performed (including Critical Care):  Procedures   ____________________________________________   INITIAL IMPRESSION / ASSESSMENT AND PLAN / ED COURSE  As part of my medical decision making, I reviewed the following data within the [place]     Patient presents purulent drainage right eye.  Left eye is erythematous.  Patient physical exam consistent with bacterial conjunctivitis.  Patient given discharge care instruction advised use eyedrops as directed.  Patient advised follow ophthalmology if no improvement or worsening complaint in 1 week.    Patricia Bolton was evaluated in Emergency Department on 05/21/2019 for the symptoms described in the history of present illness. She was evaluated in the context of the global COVID-19 pandemic, which necessitated consideration that the patient might be at risk for infection with the SARS-CoV-2 virus that causes COVID-19. Institutional protocols and algorithms that pertain to the evaluation of patients at risk for COVID-19 are in a state of rapid change based on information released by regulatory bodies including the CDC and federal and state organizations. These policies and algorithms were followed during the patient's care in the ED.       ____________________________________________   FINAL CLINICAL IMPRESSION(S) / ED DIAGNOSES  Final diagnoses:  Bacterial conjunctivitis     ED Discharge Orders         Ordered    sulfacetamide (BLEPH-10) 10 % ophthalmic solution  4 times daily     05/21/19 1129    olopatadine (PATADAY) 0.1 % ophthalmic solution  2 times daily     05/21/19 1129           Note:  This document was prepared using Dragon voice recognition software and may include unintentional dictation errors.    Sable Feil, PA-C 05/21/19 1136    Carrie Mew, MD 05/22/19  [phone]

## 2019-05-23 ENCOUNTER — Ambulatory Visit
Admission: RE | Admit: 2019-05-23 | Discharge: 2019-05-23 | Disposition: A | Discharge status: Home / Self Care | Payer: Medicare Other | Source: Ambulatory Visit | Attending: Student | Admitting: Student

## 2019-05-23 DIAGNOSIS — Z1231 Encounter for screening mammogram for malignant neoplasm of breast: Secondary | ICD-10-CM | POA: Insufficient documentation

## 2020-07-15 ENCOUNTER — Other Ambulatory Visit: Payer: Self-pay | Admitting: Family Medicine

## 2020-07-15 DIAGNOSIS — Z1231 Encounter for screening mammogram for malignant neoplasm of breast: Secondary | ICD-10-CM

## 2020-07-16 IMAGING — MG DIGITAL SCREENING BILAT W/ TOMO W/ CAD
8 series · 9 of 24 positions shown · non-contrast
Comparison: Previous exam(s).

CLINICAL DATA: Screening.

EXAM:
DIGITAL SCREENING BILATERAL MAMMOGRAM WITH TOMO AND CAD

[R MLO synth-2D]
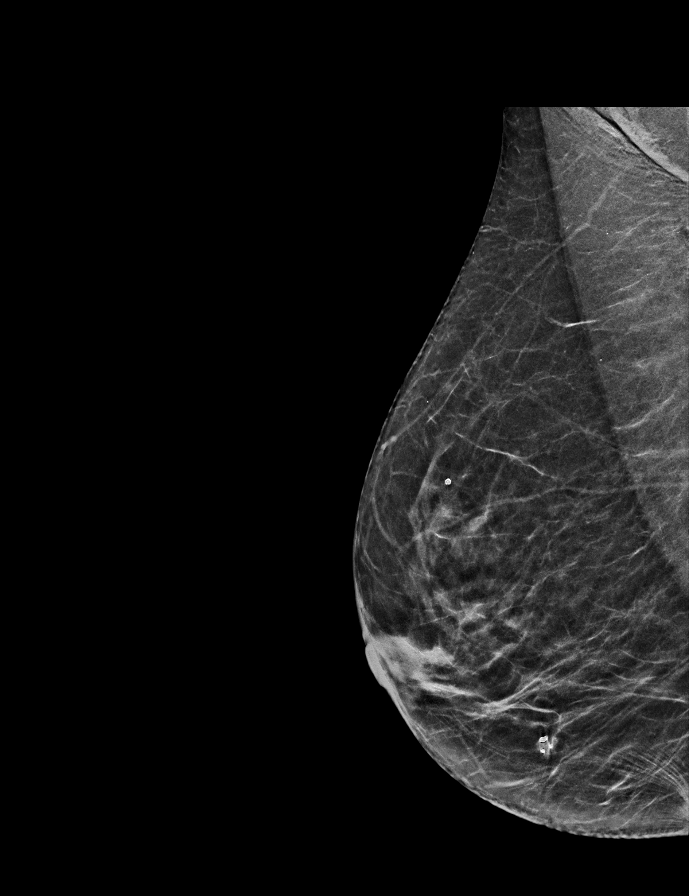

[L MLO synth-2D]
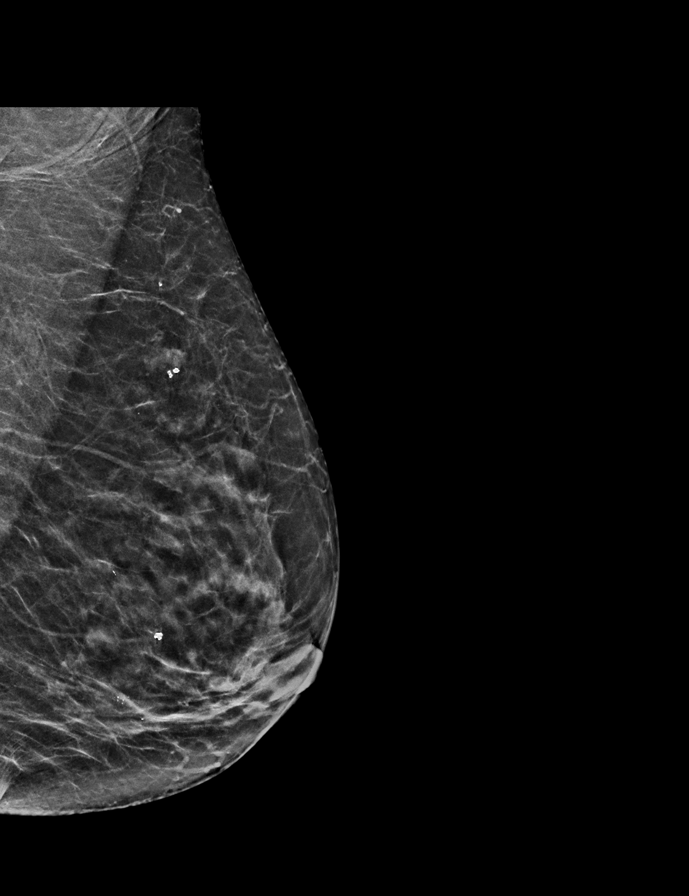

[L CC synth-2D]
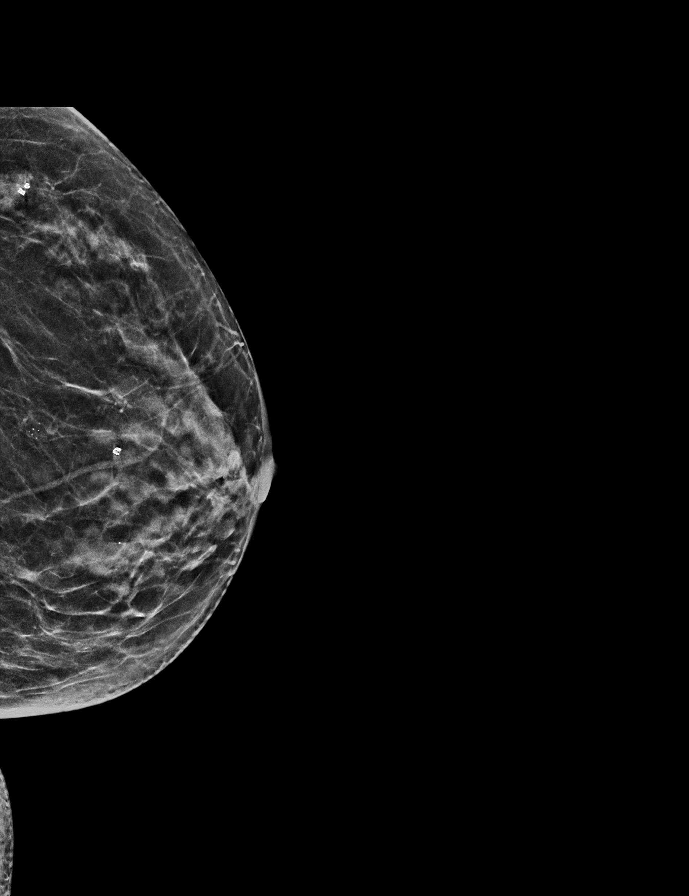

[R CC synth-2D]
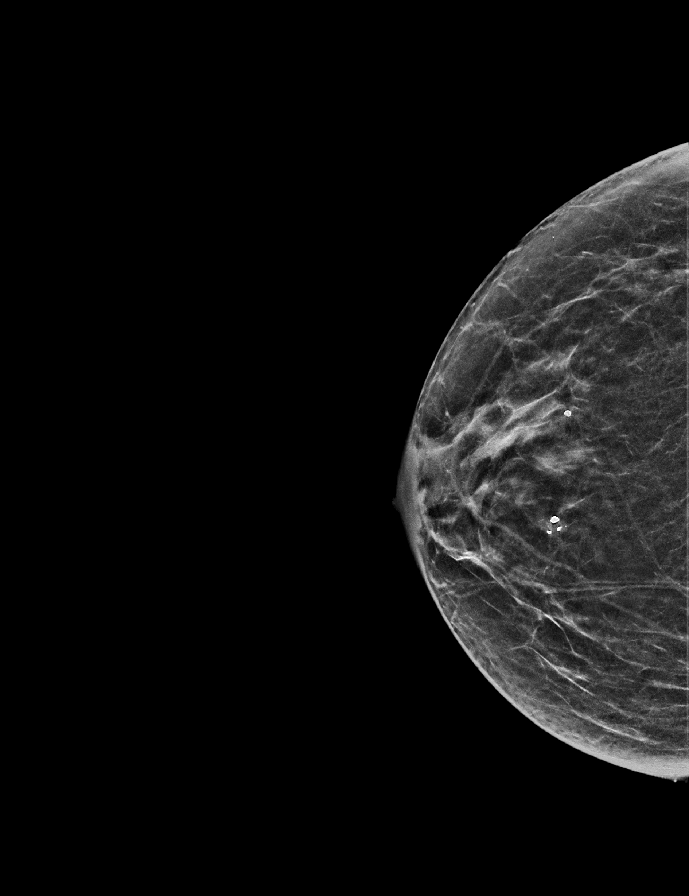

[L MLO tomo · 2 of 49 frames shown]
[frame 16/49]
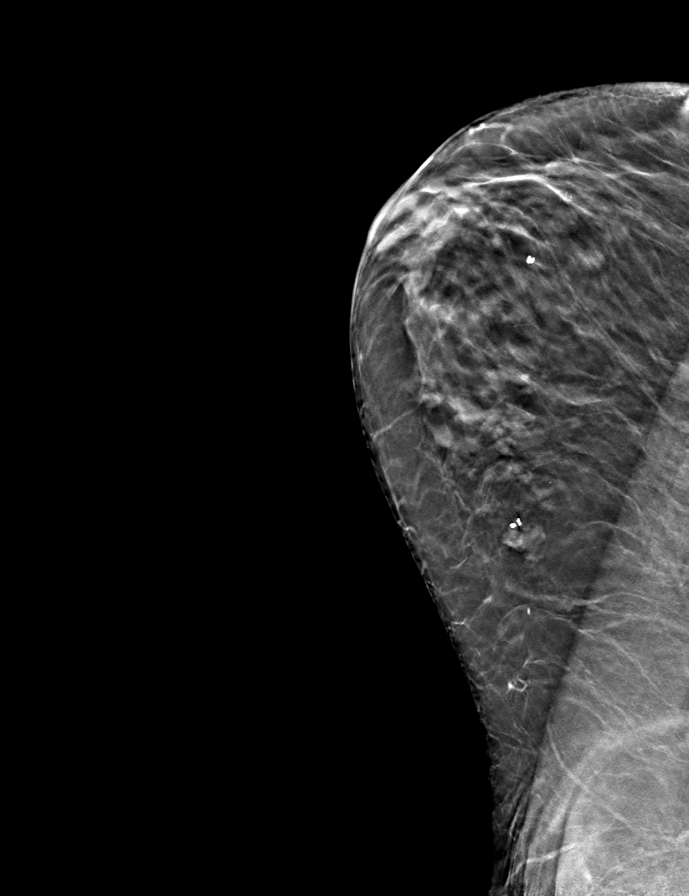
[frame 25/49]
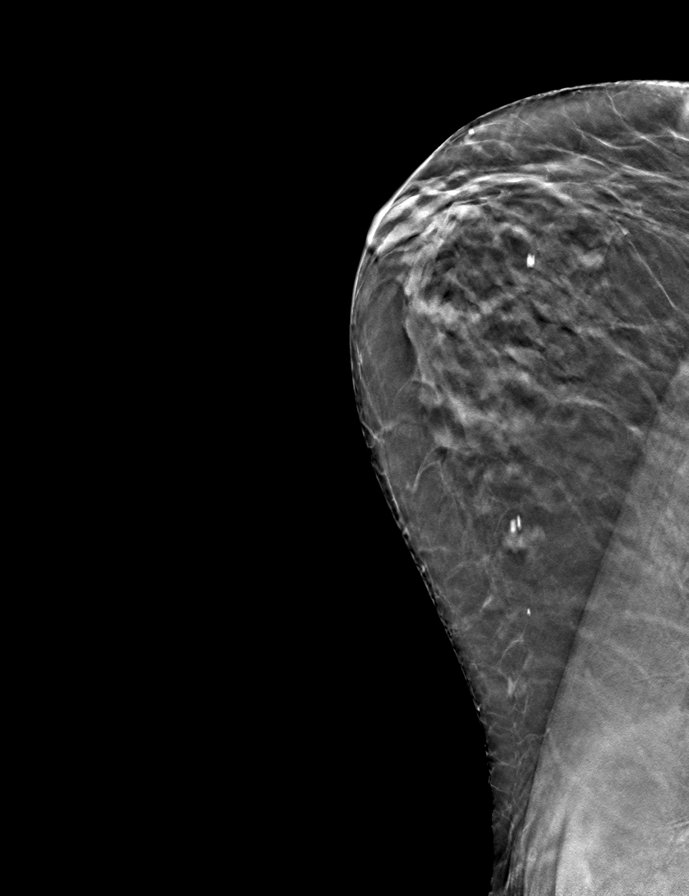

[R MLO tomo · tomo slice 25/50.0]
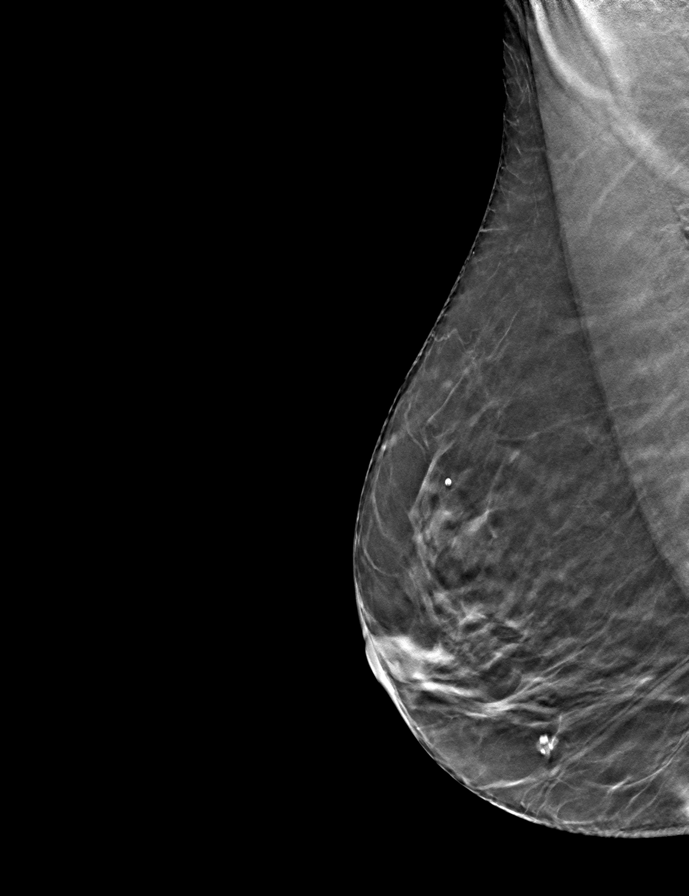

[R CC tomo · tomo slice 23/46.0]
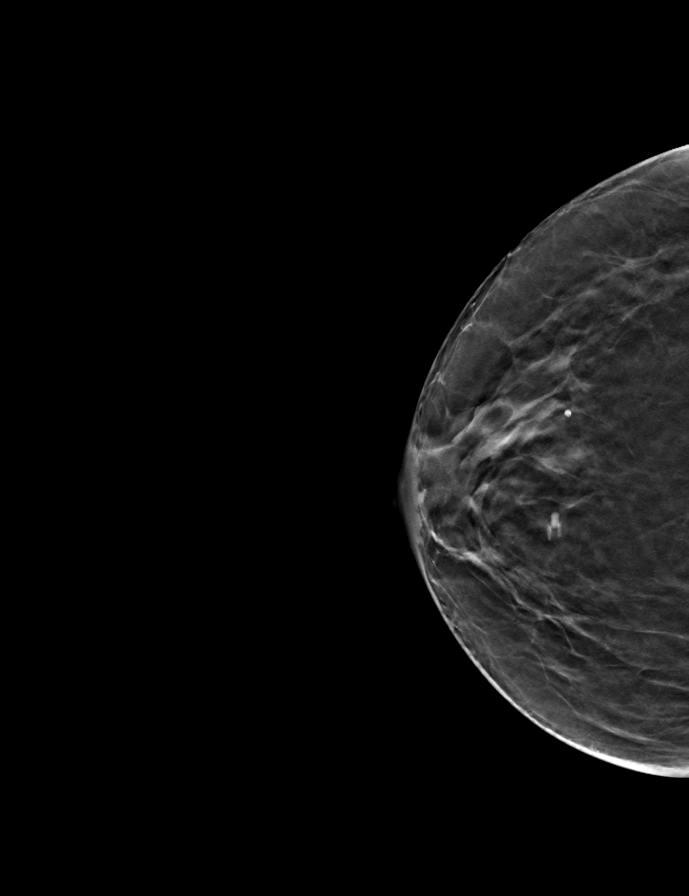

[L CC tomo · tomo slice 24/47.0]
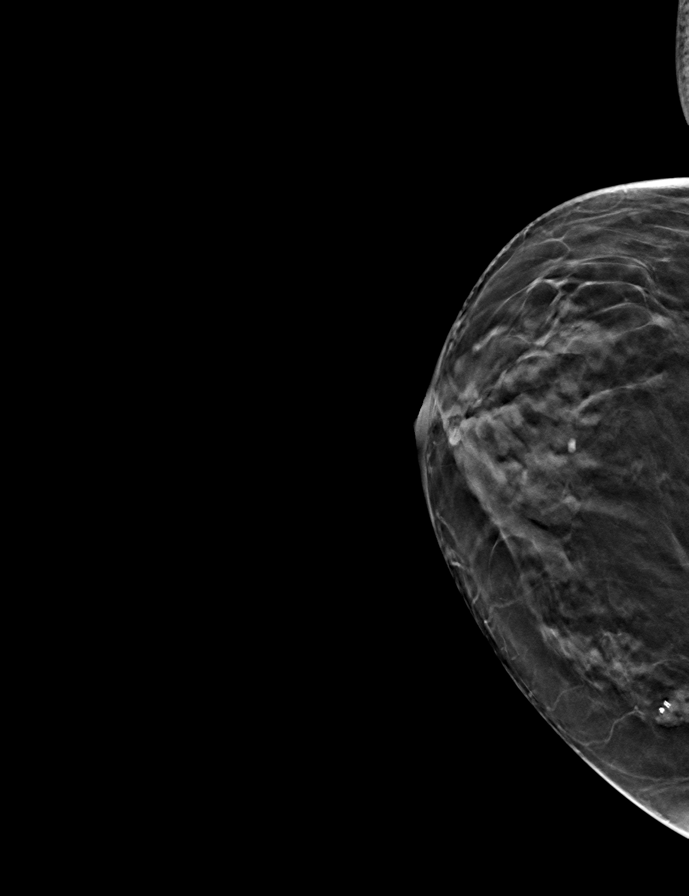

[9 of 24 positions shown; findings below may reference images not displayed]

ACR Breast Density Category c: The breast tissue is heterogeneously
dense, which may obscure small masses.
FINDINGS: There are no findings suspicious for malignancy. Images were
processed with CAD.
IMPRESSION: No mammographic evidence of malignancy. A result letter of this
screening mammogram will be mailed directly to the patient.

RECOMMENDATION:
Screening mammogram in one year. (Code:FT-U-LHB)

BI-RADS CATEGORY  1: Negative.

## 2021-02-01 ENCOUNTER — Ambulatory Visit (INDEPENDENT_AMBULATORY_CARE_PROVIDER_SITE_OTHER): Payer: Medicare Other | Admitting: Internal Medicine

## 2021-02-01 ENCOUNTER — Other Ambulatory Visit: Payer: Self-pay

## 2021-02-01 ENCOUNTER — Encounter: Payer: Self-pay | Admitting: Internal Medicine

## 2021-02-01 VITALS — BP 148/86 | HR 98 | Temp 98.4°F | Ht 64.2 in | Wt 121.0 lb

## 2021-02-01 DIAGNOSIS — Z1211 Encounter for screening for malignant neoplasm of colon: Secondary | ICD-10-CM

## 2021-02-01 DIAGNOSIS — R8271 Bacteriuria: Secondary | ICD-10-CM

## 2021-02-01 DIAGNOSIS — Z114 Encounter for screening for human immunodeficiency virus [HIV]: Secondary | ICD-10-CM | POA: Diagnosis not present

## 2021-02-01 DIAGNOSIS — Z13820 Encounter for screening for osteoporosis: Secondary | ICD-10-CM

## 2021-02-01 DIAGNOSIS — Z13220 Encounter for screening for lipoid disorders: Secondary | ICD-10-CM

## 2021-02-01 DIAGNOSIS — F0390 Unspecified dementia without behavioral disturbance: Secondary | ICD-10-CM

## 2021-02-01 DIAGNOSIS — A599 Trichomoniasis, unspecified: Secondary | ICD-10-CM

## 2021-02-01 LAB — URINALYSIS, ROUTINE W REFLEX MICROSCOPIC
Bilirubin, UA: NEGATIVE
Glucose, UA: NEGATIVE
Ketones, UA: NEGATIVE
Nitrite, UA: NEGATIVE
Protein,UA: NEGATIVE
Specific Gravity, UA: 1.025 (ref 1.005–1.030)
Urobilinogen, Ur: 1 mg/dL (ref 0.2–1.0)
pH, UA: 5.5 (ref 5.0–7.5)

## 2021-02-01 LAB — MICROSCOPIC EXAMINATION

## 2021-02-01 NOTE — Progress Notes (Signed)
BP (!) 148/86 (BP Location: Left Arm, Cuff Size: Normal)   Pulse 98   Temp 98.4 F (36.9 C) (Oral)   Ht 5' 4.2" (1.631 m)   Wt 121 lb (54.9 kg)   SpO2 98%   BMI 20.64 kg/m    Subjective:    Patient ID: Patricia Bolton, female    DOB: 06-30-1945, 75 y.o.   MRN: [id number]  Chief Complaint  Patient presents with   New Patient (Initial Visit)    No concerns per patient, daughter states that the patient has had increased memory loss    HPI: Patricia Bolton is a 75 y.o. female  Pt I shere to establish care Has had some memory loss , daughter with pt and says shes moved her in with her sec to such. Has short term memory loss. No agitation, confusion - some at times. Has sundowning. Never had pcp. Would go tot he urgent care.    Chief Complaint  Patient presents with   New Patient (Initial Visit)    No concerns per patient, daughter states that the patient has had increased memory loss    Relevant past medical, surgical, family and social history reviewed and updated as indicated. Interim medical history since our last visit reviewed. Allergies and medications reviewed and updated.  Review of Systems  Constitutional:  Negative for activity change, appetite change, chills, fatigue and fever.  HENT:  Negative for congestion, ear discharge, ear pain and facial swelling.   Eyes:  Negative for pain and itching.  Respiratory:  Negative for cough, chest tightness, shortness of breath and wheezing.   Cardiovascular:  Negative for chest pain, palpitations and leg swelling.  Gastrointestinal:  Negative for abdominal distention, abdominal pain, blood in stool, constipation, diarrhea, nausea and vomiting.  Endocrine: Negative for cold intolerance, heat intolerance, polydipsia, polyphagia and polyuria.  Genitourinary:  Negative for difficulty urinating, dysuria, flank pain, frequency, hematuria and urgency.  Musculoskeletal:  Negative for arthralgias, gait problem, joint swelling and  myalgias.  Skin:  Negative for color change, rash and wound.  Neurological:  Negative for dizziness, tremors, speech difficulty, weakness, light-headedness, numbness and headaches.  Hematological:  Does not bruise/bleed easily.  Psychiatric/Behavioral:  Negative for agitation, behavioral problems, confusion, decreased concentration, self-injury, sleep disturbance and suicidal ideas.    Per HPI unless specifically indicated above     Objective:    BP (!) 148/86 (BP Location: Left Arm, Cuff Size: Normal)   Pulse 98   Temp 98.4 F (36.9 C) (Oral)   Ht 5' 4.2" (1.631 m)   Wt 121 lb (54.9 kg)   SpO2 98%   BMI 20.64 kg/m   Wt Readings from Last 3 Encounters:  02/01/21 121 lb (54.9 kg)  05/21/19 114 lb (51.7 kg)  01/25/19 118 lb (53.5 kg)    Physical Exam Vitals and nursing note reviewed.  Constitutional:      General: She is not in acute distress.    Appearance: Normal appearance. She is not ill-appearing or diaphoretic.  Eyes:     Conjunctiva/sclera: Conjunctivae normal.  Pulmonary:     Breath sounds: No rhonchi.  Abdominal:     General: Abdomen is flat. Bowel sounds are normal. There is no distension.     Palpations: Abdomen is soft. There is no mass.     Tenderness: There is no abdominal tenderness. There is no guarding.  Skin:    General: Skin is warm and dry.     Coloration: Skin is not jaundiced.  Findings: No erythema.  Neurological:     Mental Status: She is alert.    Results for orders placed or performed during the hospital encounter of 01/25/19  CBC  Result Value Ref Range   WBC 7.6 4.0 - 10.5 K/uL   RBC 4.33 3.87 - 5.11 MIL/uL   Hemoglobin 13.0 12.0 - 15.0 g/dL   HCT 38.1 36.0 - 46.0 %   MCV 88.0 80.0 - 100.0 fL   MCH 30.0 26.0 - 34.0 pg   MCHC 34.1 30.0 - 36.0 g/dL   RDW 12.1 11.5 - 15.5 %   Platelets 186 150 - 400 K/uL   nRBC 0.0 0.0 - 0.2 %  Basic metabolic panel  Result Value Ref Range   Sodium 139 135 - 145 mmol/L   Potassium 3.6 3.5 - 5.1  mmol/L   Chloride 104 98 - 111 mmol/L   CO2 27 22 - 32 mmol/L   Glucose, Bld 92 70 - 99 mg/dL   BUN 14 8 - 23 mg/dL   Creatinine, Ser 0.75 0.44 - 1.00 mg/dL   Calcium 8.7 (L) 8.9 - 10.3 mg/dL   GFR calc non Af Amer >60 >60 mL/min   GFR calc Af Amer >60 >60 mL/min   Anion gap 8 5 - 15  Magnesium  Result Value Ref Range   Magnesium 2.2 1.7 - 2.4 mg/dL  TSH  Result Value Ref Range   TSH 1.269 0.350 - 4.500 uIU/mL       No current outpatient medications on file.    Assessment & Plan:   Dementia :   Consider starting pt on aricept 5 mg daily. check B12, folate, TSH, RPR. Patient will be referred to Neurolog appropriate paperwork given to caregiver with information regarding the care for patient with dementia.   Trichomoniasis per UA will recheck  D/w Daughter who is pts POA as pt has Dementia.  Not sure about pts sexual activity status.  Will need to rx for such  if still positive    Problem List Items Addressed This Visit   None Visit Diagnoses     Screening for colon cancer    -  Primary   Relevant Orders   Ambulatory referral to Gastroenterology   Screening for osteoporosis       Relevant Orders   DG Bone Density        Orders Placed This Encounter  Procedures   DG Bone Density   Ambulatory referral to Gastroenterology     No orders of the defined types were placed in this encounter.    Follow up plan: No follow-ups on file.

## 2021-02-02 ENCOUNTER — Other Ambulatory Visit: Payer: Self-pay

## 2021-02-02 ENCOUNTER — Other Ambulatory Visit: Payer: Medicare Other

## 2021-02-02 DIAGNOSIS — R8271 Bacteriuria: Secondary | ICD-10-CM

## 2021-02-02 LAB — CBC WITH DIFFERENTIAL/PLATELET
Basophils Absolute: 0 10*3/uL (ref 0.0–0.2)
Basos: 0 %
EOS (ABSOLUTE): 0 10*3/uL (ref 0.0–0.4)
Eos: 0 %
Hematocrit: 41.6 % (ref 34.0–46.6)
Hemoglobin: 14.5 g/dL (ref 11.1–15.9)
Immature Grans (Abs): 0 10*3/uL (ref 0.0–0.1)
Immature Granulocytes: 0 %
Lymphocytes Absolute: 1.5 10*3/uL (ref 0.7–3.1)
Lymphs: 19 %
MCH: 31.1 pg (ref 26.6–33.0)
MCHC: 34.9 g/dL (ref 31.5–35.7)
MCV: 89 fL (ref 79–97)
Monocytes Absolute: 0.5 10*3/uL (ref 0.1–0.9)
Monocytes: 7 %
Neutrophils Absolute: 5.9 10*3/uL (ref 1.4–7.0)
Neutrophils: 74 %
Platelets: 192 10*3/uL (ref 150–450)
RBC: 4.66 x10E6/uL (ref 3.77–5.28)
RDW: 12.5 % (ref 11.7–15.4)
WBC: 8 10*3/uL (ref 3.4–10.8)

## 2021-02-02 LAB — COMPREHENSIVE METABOLIC PANEL
ALT: 16 IU/L (ref 0–32)
AST: 25 IU/L (ref 0–40)
Albumin/Globulin Ratio: 1.6 (ref 1.2–2.2)
Albumin: 4.6 g/dL (ref 3.7–4.7)
Alkaline Phosphatase: 68 IU/L (ref 44–121)
BUN/Creatinine Ratio: 15 (ref 12–28)
BUN: 12 mg/dL (ref 8–27)
Bilirubin Total: 0.5 mg/dL (ref 0.0–1.2)
CO2: 24 mmol/L (ref 20–29)
Calcium: 9.9 mg/dL (ref 8.7–10.3)
Chloride: 102 mmol/L (ref 96–106)
Creatinine, Ser: 0.81 mg/dL (ref 0.57–1.00)
Globulin, Total: 2.9 g/dL (ref 1.5–4.5)
Glucose: 118 mg/dL — ABNORMAL HIGH (ref 65–99)
Potassium: 4 mmol/L (ref 3.5–5.2)
Sodium: 141 mmol/L (ref 134–144)
Total Protein: 7.5 g/dL (ref 6.0–8.5)
eGFR: 77 mL/min/{1.73_m2} (ref >59)

## 2021-02-02 LAB — URINALYSIS, ROUTINE W REFLEX MICROSCOPIC
Bilirubin, UA: NEGATIVE
Glucose, UA: NEGATIVE
Nitrite, UA: NEGATIVE
Protein,UA: NEGATIVE
Specific Gravity, UA: 1.015 (ref 1.005–1.030)
Urobilinogen, Ur: 2 mg/dL — ABNORMAL HIGH (ref 0.2–1.0)
pH, UA: 6 (ref 5.0–7.5)

## 2021-02-02 LAB — RPR: RPR Ser Ql: NONREACTIVE

## 2021-02-02 LAB — MICROSCOPIC EXAMINATION

## 2021-02-02 LAB — FOLATE: Folate: 11.9 ng/mL (ref >3.0)

## 2021-02-02 LAB — VITAMIN B12: Vitamin B-12: 274 pg/mL (ref 232–1245)

## 2021-02-02 LAB — HIV ANTIBODY (ROUTINE TESTING W REFLEX): HIV Screen 4th Generation wRfx: NONREACTIVE

## 2021-02-02 LAB — TSH: TSH: 0.789 u[IU]/mL (ref 0.450–4.500)

## 2021-02-02 MED ORDER — METRONIDAZOLE 500 MG PO TABS: 500.0000 mg | ORAL_TABLET | Freq: Two times a day (BID) | ORAL | 0 refills | Status: AC

## 2021-02-02 NOTE — Progress Notes (Signed)
Pt has trichomoniasis in her UA, will recheck tommorow am She has dementia and daughter is her POA, will need to d/w pt about sexual activity. She recenlty moved in with her daughter. Marland Kitchen

## 2021-02-02 NOTE — Progress Notes (Signed)
Please let pt know I am sending in Metronidazole bid x 7 days. Will need to rtc x 2 weeks and recheck UA and follow up. Repeat UA done today shows trichomonas infection.  Thanks.

## 2021-02-02 NOTE — Addendum Note (Signed)
Addended byCharlynne Cousins on: 02/02/2021 09:20 PM   Modules accepted: Orders

## 2021-02-04 LAB — LIPID PANEL
Chol/HDL Ratio: 4.4 ratio (ref 0.0–4.4)
Cholesterol, Total: 233 mg/dL — ABNORMAL HIGH (ref 100–199)
HDL: 53 mg/dL (ref >39)
LDL Chol Calc (NIH): 147 mg/dL — ABNORMAL HIGH (ref 0–99)
Triglycerides: 185 mg/dL — ABNORMAL HIGH (ref 0–149)
VLDL Cholesterol Cal: 33 mg/dL (ref 5–40)

## 2021-02-04 LAB — SPECIMEN STATUS REPORT

## 2021-02-06 LAB — URINE CULTURE

## 2021-02-10 ENCOUNTER — Encounter: Payer: Self-pay | Admitting: Internal Medicine

## 2021-02-16 ENCOUNTER — Telehealth: Payer: Self-pay

## 2021-02-16 NOTE — Telephone Encounter (Signed)
Patient wants to reschedule procedure. Clinical staff will follow  up with patient.

## 2021-02-17 ENCOUNTER — Telehealth: Payer: Self-pay

## 2021-02-17 NOTE — Telephone Encounter (Signed)
Pt. Daughter returning call she said anytime today she can be reached to schedule that colonoscopy.

## 2021-02-17 NOTE — Telephone Encounter (Signed)
Returned call to patient's daughter. Unable to leave message. Voicemail full.

## 2021-02-21 ENCOUNTER — Other Ambulatory Visit: Payer: Self-pay

## 2021-02-21 DIAGNOSIS — Z1211 Encounter for screening for malignant neoplasm of colon: Secondary | ICD-10-CM

## 2021-02-21 MED ORDER — NA SULFATE-K SULFATE-MG SULF 17.5-3.13-1.6 GM/177ML PO SOLN: 1.0000 | Freq: Once | ORAL | 0 refills | Status: AC

## 2021-02-21 NOTE — Telephone Encounter (Signed)
Patient has been scheduled for 03/18/21.

## 2021-02-21 NOTE — Progress Notes (Signed)
Gastroenterology Pre-Procedure Review  Request Date: 03/18/21 Requesting Physician: Dr. Bonna Gains  PATIENT REVIEW QUESTIONS: The patient responded to the following health history questions as indicated:  Spoke with patients daughter Ivin Booty) to complete screening.  1. Are you having any GI issues? no 2. Do you have a personal history of Polyps? no 3. Do you have a family history of Colon Cancer or Polyps? yes (Daughter colon polyps) 4. Diabetes Mellitus? no 5. Joint replacements in the past 12 months?no 6. Major health problems in the past 3 months?no 7. Any artificial heart valves, MVP, or defibrillator?no    MEDICATIONS & ALLERGIES:    Patient reports the following regarding taking any anticoagulation/antiplatelet therapy:   Plavix, Coumadin, Eliquis, Xarelto, Lovenox, Pradaxa, Brilinta, or Effient? no Aspirin? no  Patient confirms/reports the following medications:  No current outpatient medications on file.   No current facility-administered medications for this visit.    Patient confirms/reports the following allergies:  Allergies  Allergen Reactions   Aspirin Other (See Comments)    Heart flutter    No orders of the defined types were placed in this encounter.   AUTHORIZATION INFORMATION Primary Insurance: 1D#: Group #:  Secondary Insurance: 1D#: Group #:  SCHEDULE INFORMATION: Date: 03/18/21 Time: Location: [place]

## 2021-02-22 ENCOUNTER — Ambulatory Visit: Payer: Medicare Other | Admitting: Internal Medicine

## 2021-02-23 ENCOUNTER — Ambulatory Visit: Payer: Medicare Other | Admitting: Internal Medicine

## 2021-02-28 ENCOUNTER — Other Ambulatory Visit: Payer: Self-pay

## 2021-02-28 ENCOUNTER — Encounter: Payer: Self-pay | Admitting: Internal Medicine

## 2021-02-28 ENCOUNTER — Ambulatory Visit (INDEPENDENT_AMBULATORY_CARE_PROVIDER_SITE_OTHER): Payer: Medicare Other | Admitting: Internal Medicine

## 2021-02-28 VITALS — BP 128/60 | HR 77 | Temp 98.1°F | Ht 64.37 in | Wt 122.0 lb

## 2021-02-28 DIAGNOSIS — R739 Hyperglycemia, unspecified: Secondary | ICD-10-CM | POA: Diagnosis not present

## 2021-02-28 DIAGNOSIS — E785 Hyperlipidemia, unspecified: Secondary | ICD-10-CM | POA: Diagnosis not present

## 2021-02-28 DIAGNOSIS — F03918 Unspecified dementia, unspecified severity, with other behavioral disturbance: Secondary | ICD-10-CM

## 2021-02-28 LAB — URINALYSIS, ROUTINE W REFLEX MICROSCOPIC
Bilirubin, UA: NEGATIVE
Glucose, UA: NEGATIVE
Ketones, UA: NEGATIVE
Leukocytes,UA: NEGATIVE
Nitrite, UA: NEGATIVE
Protein,UA: NEGATIVE
RBC, UA: NEGATIVE
Specific Gravity, UA: 1.02 (ref 1.005–1.030)
Urobilinogen, Ur: 0.2 mg/dL (ref 0.2–1.0)
pH, UA: 5.5 (ref 5.0–7.5)

## 2021-02-28 LAB — BAYER DCA HB A1C WAIVED: HB A1C (BAYER DCA - WAIVED): 6.1 % — ABNORMAL HIGH (ref 4.8–5.6)

## 2021-02-28 NOTE — Progress Notes (Signed)
BP (!) 155/81   Pulse 77   Temp 98.1 F (36.7 C) (Oral)   Ht 5' 4.37" (1.635 m)   Wt 122 lb (55.3 kg)   SpO2 99%   BMI 20.70 kg/m    Subjective:    Patient ID: Patricia Bolton, female    DOB: 14-May-1946, 75 y.o.   MRN: [id number]  Chief Complaint  Patient presents with  . Dementia    HPI: Patricia Bolton is a 75 y.o. female  Pt is here for a fu, she was found to have trichomonas in her UA last visit was rx for such.  Hand hygiene advised  Dementia - is able to hold conversations, usually worse in the evening, sundowing   Chief Complaint  Patient presents with  . Dementia    Relevant past medical, surgical, family and social history reviewed and updated as indicated. Interim medical history since our last visit reviewed. Allergies and medications reviewed and updated.  Review of Systems  Per HPI unless specifically indicated above     Objective:    BP (!) 155/81   Pulse 77   Temp 98.1 F (36.7 C) (Oral)   Ht 5' 4.37" (1.635 m)   Wt 122 lb (55.3 kg)   SpO2 99%   BMI 20.70 kg/m   Wt Readings from Last 3 Encounters:  02/28/21 122 lb (55.3 kg)  02/01/21 121 lb (54.9 kg)  05/21/19 114 lb (51.7 kg)    Physical Exam Vitals and nursing note reviewed.  Constitutional:      General: She is not in acute distress.    Appearance: Normal appearance. She is not ill-appearing or diaphoretic.  Eyes:     Conjunctiva/sclera: Conjunctivae normal.  Cardiovascular:     Rate and Rhythm: Normal rate and regular rhythm.  Pulmonary:     Effort: No respiratory distress.     Breath sounds: No wheezing or rhonchi.  Abdominal:     General: Bowel sounds are normal. There is no distension.     Palpations: Abdomen is soft. There is no mass.     Tenderness: There is no abdominal tenderness. There is no guarding.  Musculoskeletal:        General: No swelling, tenderness, deformity or signs of injury.  Skin:    General: Skin is warm and dry.     Coloration: Skin is not  jaundiced.     Findings: No erythema.  Neurological:     Mental Status: She is alert.  Psychiatric:        Mood and Affect: Mood normal.        Behavior: Behavior normal.        Thought Content: Thought content normal.        Judgment: Judgment normal.    Results for orders placed or performed in visit on 02/02/21  Microscopic Examination   Urine  Result Value Ref Range   WBC, UA 0-5 0 - 5 /hpf   RBC 0-2 0 - 2 /hpf   Epithelial Cells (non renal) 0-10 0 - 10 /hpf   Bacteria, UA Moderate (A) None seen/Few   Trichomonas, UA Present (A) None seen  Urinalysis, Routine w reflex microscopic  Result Value Ref Range   Specific Gravity, UA 1.015 1.005 - 1.030   pH, UA 6.0 5.0 - 7.5   Color, UA Yellow Yellow   Appearance Ur Cloudy (A) Clear   Leukocytes,UA 1+ (A) Negative   Protein,UA Negative Negative/Trace   Glucose, UA Negative Negative  Ketones, UA Trace (A) Negative   RBC, UA Trace (A) Negative   Bilirubin, UA Negative Negative   Urobilinogen, Ur 2.0 (H) 0.2 - 1.0 mg/dL   Nitrite, UA Negative Negative   Microscopic Examination See below:        No current outpatient medications on file.    Assessment & Plan:  Dementia :  B12, folate, TSH, RPR - wnl  Patient will be referred to Neurology appropriate paperwork given to caregiver with information regarding the care for patient with dementia. Patient probably has had severe dementia for a while now. Not been treated for such.   2. Trichomonas in UA  Feco oral route of transmission d/w pt she will need to improve hand hygiene.  She denies recnet Sexual activity.  Recheck S/p abx.    Problem List Items Addressed This Visit   None    No orders of the defined types were placed in this encounter.    No orders of the defined types were placed in this encounter.    Follow up plan: No follow-ups on file.

## 2021-03-01 LAB — BASIC METABOLIC PANEL
BUN/Creatinine Ratio: 10 — ABNORMAL LOW (ref 12–28)
BUN: 8 mg/dL (ref 8–27)
CO2: 24 mmol/L (ref 20–29)
Calcium: 9.1 mg/dL (ref 8.7–10.3)
Chloride: 105 mmol/L (ref 96–106)
Creatinine, Ser: 0.81 mg/dL (ref 0.57–1.00)
Glucose: 95 mg/dL (ref 70–99)
Potassium: 4 mmol/L (ref 3.5–5.2)
Sodium: 141 mmol/L (ref 134–144)
eGFR: 77 mL/min/{1.73_m2} (ref >59)

## 2021-03-17 ENCOUNTER — Encounter: Payer: Self-pay | Admitting: Gastroenterology

## 2021-03-18 ENCOUNTER — Encounter
Admission: RE | Disposition: A | Discharge status: Home / Self Care | Payer: Self-pay | Source: Home / Self Care | Attending: Gastroenterology

## 2021-03-18 ENCOUNTER — Ambulatory Visit: Payer: Medicare Other | Admitting: Certified Registered"

## 2021-03-18 ENCOUNTER — Ambulatory Visit
Admission: RE | Admit: 2021-03-18 | Discharge: 2021-03-18 | Disposition: A | Discharge status: Home / Self Care | Payer: Medicare Other | Attending: Gastroenterology | Admitting: Gastroenterology

## 2021-03-18 ENCOUNTER — Encounter: Payer: Self-pay | Admitting: Gastroenterology

## 2021-03-18 ENCOUNTER — Other Ambulatory Visit: Payer: Self-pay

## 2021-03-18 DIAGNOSIS — D124 Benign neoplasm of descending colon: Secondary | ICD-10-CM | POA: Insufficient documentation

## 2021-03-18 DIAGNOSIS — Z1211 Encounter for screening for malignant neoplasm of colon: Secondary | ICD-10-CM

## 2021-03-18 DIAGNOSIS — K5730 Diverticulosis of large intestine without perforation or abscess without bleeding: Secondary | ICD-10-CM | POA: Diagnosis not present

## 2021-03-18 DIAGNOSIS — Z801 Family history of malignant neoplasm of trachea, bronchus and lung: Secondary | ICD-10-CM | POA: Insufficient documentation

## 2021-03-18 DIAGNOSIS — K635 Polyp of colon: Secondary | ICD-10-CM

## 2021-03-18 DIAGNOSIS — Z803 Family history of malignant neoplasm of breast: Secondary | ICD-10-CM | POA: Diagnosis not present

## 2021-03-18 HISTORY — PX: COLONOSCOPY WITH PROPOFOL: SHX5780

## 2021-03-18 SURGERY — COLONOSCOPY WITH PROPOFOL
Anesthesia: General

## 2021-03-18 MED ORDER — PROPOFOL 10 MG/ML IV BOLUS
INTRAVENOUS | Status: DC | PRN
  Administered 2021-03-18: 70 mg via INTRAVENOUS

## 2021-03-18 MED ORDER — SODIUM CHLORIDE 0.9 % IV SOLN: INTRAVENOUS | Status: DC

## 2021-03-18 MED ORDER — PROPOFOL 500 MG/50ML IV EMUL
INTRAVENOUS | Status: AC
  Filled 2021-03-18: qty 50

## 2021-03-18 MED ORDER — PROPOFOL 500 MG/50ML IV EMUL
INTRAVENOUS | Status: DC | PRN
  Administered 2021-03-18: 120 ug/kg/min via INTRAVENOUS

## 2021-03-18 NOTE — Anesthesia Preprocedure Evaluation (Addendum)
Anesthesia Evaluation  Patient identified by MRN, date of birth, ID band Patient awake    Reviewed: Allergy & Precautions, H&P , NPO status , Patient's Chart, lab work & pertinent test results  History of Anesthesia Complications Negative for: history of anesthetic complications  Airway Mallampati: II  TM Distance: >3 FB Neck ROM: full    Dental  (+)    Pulmonary neg sleep apnea, neg COPD, former smoker,    Pulmonary exam normal        Cardiovascular hypertension, (-) angina(-) Past MI and (-) Cardiac Stents Normal cardiovascular exam(-) dysrhythmias      Neuro/Psych PSYCHIATRIC DISORDERS Dementia negative neurological ROS     GI/Hepatic negative GI ROS, Neg liver ROS,   Endo/Other  negative endocrine ROS  Renal/GU negative Renal ROS  negative genitourinary   Musculoskeletal   Abdominal   Peds  Hematology negative hematology ROS (+)   Anesthesia Other Findings Past Medical History: No date: Hypertension  Past Surgical History: No date: TOTAL ABDOMINAL HYSTERECTOMY W/ BILATERAL SALPINGOOPHORECTOMY  BMI    Body Mass Index: 22.31 kg/m      Reproductive/Obstetrics negative OB ROS                            Anesthesia Physical Anesthesia Plan  ASA: 2  Anesthesia Plan: General   Post-op Pain Management:    Induction:   PONV Risk Score and Plan: Propofol infusion and TIVA  Airway Management Planned: Nasal Cannula  Additional Equipment:   Intra-op Plan:   Post-operative Plan:   Informed Consent: I have reviewed the patients History and Physical, chart, labs and discussed the procedure including the risks, benefits and alternatives for the proposed anesthesia with the patient or authorized representative who has indicated his/her understanding and acceptance.     Dental Advisory Given  Plan Discussed with: Anesthesiologist, CRNA and Surgeon  Anesthesia Plan Comments:         Anesthesia Quick Evaluation

## 2021-03-18 NOTE — Transfer of Care (Signed)
Immediate Anesthesia Transfer of Care Note  Patient: Patricia Bolton  Procedure(s) Performed: COLONOSCOPY WITH PROPOFOL  Patient Location: PACU and Endoscopy Unit  Anesthesia Type:General  Level of Consciousness: drowsy  Airway & Oxygen Therapy: Patient Spontanous Breathing  Post-op Assessment: Report given to RN  Post vital signs: stable  Last Vitals:  Vitals Value Taken Time  BP    Temp    Pulse    Resp    SpO2      Last Pain:  Vitals:   03/18/21 0957  TempSrc: Temporal  PainSc: 0-No pain         Complications: No notable events documented.

## 2021-03-18 NOTE — H&P (Signed)
Vonda Antigua, MD [address], [place], [place], [place], [address] [address], [place], [place], [place], [address] Phone: [phone]  Fax: [phone]  Primary Care Physician:  Charlynne Cousins, MD   Pre-Procedure History & Physical: HPI:  Patricia Bolton is a 75 y.o. female is here for a colonoscopy.   Past Medical History:  Diagnosis Date   Hypertension     Past Surgical History:  Procedure Laterality Date   TOTAL ABDOMINAL HYSTERECTOMY W/ BILATERAL SALPINGOOPHORECTOMY      Prior to Admission medications   Not on File    Allergies as of 02/21/2021 - Review Complete 02/10/2021  Allergen Reaction Noted   Aspirin Other (See Comments) 01/13/2017    Family History  Problem Relation Age of Onset   Lung cancer Mother    Emphysema Father    Hypertension Sister    Diabetes Sister    Hypertension Daughter    Sickle cell anemia Son    Breast cancer Maternal Aunt 36    Social History   Socioeconomic History   Marital status: Divorced    Spouse name: Not on file   Number of children: Not on file   Years of education: Not on file   Highest education level: Not on file  Occupational History   Not on file  Tobacco Use   Smoking status: Former    Types: Cigarettes    Quit date: 2018    Years since quitting: 4.8   Smokeless tobacco: Never  Vaping Use   Vaping Use: Never used  Substance and Sexual Activity   Alcohol use: Not Currently    Alcohol/week: 7.0 standard drinks    Types: 7 Cans of beer per week    Comment: quit 14yrs ago   Drug use: Never   Sexual activity: Not Currently  Other Topics Concern   Not on file  Social History Narrative   Not on file   Social Determinants of Health   Financial Resource Strain: Not on file  Food Insecurity: Not on file  Transportation Needs: Not on file  Physical Activity: Not on file  Stress: Not on file  Social Connections: Not on file  Intimate Partner Violence: Not on file    Review of  Systems: See HPI, otherwise negative ROS  Physical Exam: Constitutional: General:   Alert,  Well-developed, well-nourished, pleasant and cooperative in NAD BP (!) 155/86   Pulse 95   Temp (!) 96.3 F (35.7 C) (Temporal)   Resp 16   Ht 5\' 2"  (1.575 m)   Wt 55.3 kg   SpO2 100%   BMI 22.31 kg/m   Head: Normocephalic, atraumatic.   Eyes:  Sclera clear, no icterus.   Conjunctiva pink.   Mouth:  No deformity or lesions, oropharynx pink & moist.  Neck:  Supple, trachea midline  Respiratory: Normal respiratory effort  Gastrointestinal:  Soft, non-tender and non-distended without masses, hepatosplenomegaly or hernias noted.  No guarding or rebound tenderness.     Cardiac: No clubbing or edema.  No cyanosis. Normal posterior tibial pedal pulses noted.  Lymphatic:  No significant cervical adenopathy.  Psych:  Alert and cooperative. Normal mood and affect.  Musculoskeletal:   Symmetrical without gross deformities. 5/5 Lower extremity strength bilaterally.  Skin: Warm. Intact without significant lesions or rashes. No jaundice.  Neurologic:  Face symmetrical, tongue midline, Normal sensation to touch;  grossly normal neurologically.  Psych:  Alert and oriented x3, Alert and cooperative. Normal mood and affect.  Impression/Plan: Patricia Bolton  is here for a colonoscopy to be performed for average risk screening.  Risks, benefits, limitations, and alternatives regarding  colonoscopy have been reviewed with the patient.  Questions have been answered.  All parties agreeable.   Virgel Manifold, MD  03/18/2021, 11:15 AM

## 2021-03-18 NOTE — Anesthesia Postprocedure Evaluation (Signed)
Anesthesia Post Note  Patient: Patricia Bolton  Procedure(s) Performed: COLONOSCOPY WITH PROPOFOL  Patient location during evaluation: PACU Anesthesia Type: General Level of consciousness: awake and alert Pain management: pain level controlled Vital Signs Assessment: post-procedure vital signs reviewed and stable Respiratory status: spontaneous breathing, nonlabored ventilation and respiratory function stable Cardiovascular status: blood pressure returned to baseline and stable Postop Assessment: no apparent nausea or vomiting Anesthetic complications: no   No notable events documented.   Last Vitals:  Vitals:   03/18/21 1200 03/18/21 1210  BP: 113/78 140/74  Pulse: 83   Resp: 13   Temp:    SpO2: 100%     Last Pain:  Vitals:   03/18/21 1210  TempSrc:   PainSc: 0-No pain                 Brett Canales Laquitha Heslin

## 2021-03-18 NOTE — Op Note (Signed)
[facility] Gastroenterology Patient Name: Patricia Bolton Procedure Date: 03/18/2021 11:11 AM MRN: [id number] Account #: [id number] Date of Birth: Jan 12, 1946 Admit Type: Outpatient Age: 75 Room: [facility] ENDO ROOM 2 Gender: Female Note Status: Finalized Instrument Name: Jasper Riling [id number] Procedure:             Colonoscopy Indications:           Screening for colorectal malignant neoplasm Providers:             Kyrstyn Greear B. Bonna Gains MD, MD Referring MD:          Charlynne Cousins (Referring MD) Medicines:             Monitored Anesthesia Care Complications:         No immediate complications. Procedure:             Pre-Anesthesia Assessment:                        - ASA Grade Assessment: II - A patient with mild                         systemic disease.                        - Prior to the procedure, a History and Physical was                         performed, and patient medications, allergies and                         sensitivities were reviewed. The patient's tolerance                         of previous anesthesia was reviewed.                        - The risks and benefits of the procedure and the                         sedation options and risks were discussed with the                         patient. All questions were answered and informed                         consent was obtained.                        - Patient identification and proposed procedure were                         verified prior to the procedure by the physician, the                         nurse, the anesthesiologist, the anesthetist and the                         technician. The procedure was verified in the  procedure room.                        After obtaining informed consent, the colonoscope was                         passed under direct vision. Throughout the procedure,                         the patient's blood pressure, pulse, and oxygen                          saturations were monitored continuously. The                         Colonoscope was introduced through the anus and                         advanced to the the cecum, identified by appendiceal                         orifice and ileocecal valve. The colonoscopy was                         performed with ease. The patient tolerated the                         procedure well. The quality of the bowel preparation                         was good. Findings:      The perianal and digital rectal examinations were normal.      A 6 mm polyp was found in the descending colon. The polyp was sessile.       The polyp was removed with a cold snare. Resection and retrieval were       complete.      Multiple diverticula were found in the entire colon.      The exam was otherwise without abnormality.      The rectum, sigmoid colon, descending colon, transverse colon, ascending       colon and cecum appeared normal.      The retroflexed view of the distal rectum and anal verge was normal and       showed no anal or rectal abnormalities. Impression:            - One 6 mm polyp in the descending colon, removed with                         a cold snare. Resected and retrieved.                        - Diverticulosis in the entire examined colon.                        - The examination was otherwise normal.                        - The rectum, sigmoid colon, descending colon,  transverse colon, ascending colon and cecum are normal.                        - The distal rectum and anal verge are normal on                         retroflexion view. Recommendation:        - Discharge patient to home (with escort).                        - Advance diet as tolerated.                        - Continue present medications.                        - Await pathology results.                        - Repeat colonoscopy date to be determined after                         pending pathology  results are reviewed.                        - The findings and recommendations were discussed with                         the patient.                        - The findings and recommendations were discussed with                         the patient's family.                        - Return to primary care physician as previously                         scheduled.                        - High fiber diet. Procedure Code(s):     --- Professional ---                        [phone], Colonoscopy, flexible; with removal of                         tumor(s), polyp(s), or other lesion(s) by snare                         technique Diagnosis Code(s):     --- Professional ---                        Z12.11, Encounter for screening for malignant neoplasm                         of colon  K63.5, Polyp of colon CPT copyright 2019 American Medical Association. All rights reserved. The codes documented in this report are preliminary and upon coder review may  be revised to meet current compliance requirements.  Vonda Antigua, MD Margretta Sidle B. Bonna Gains MD, MD 03/18/2021 11:52:19 AM This report has been signed electronically. Number of Addenda: 0 Note Initiated On: 03/18/2021 11:11 AM Scope Withdrawal Time: 0 hours 17 minutes 59 seconds  Total Procedure Duration: 0 hours 25 minutes 12 seconds  Estimated Blood Loss:  Estimated blood loss: none.      [facility]

## 2021-03-21 ENCOUNTER — Encounter: Payer: Self-pay | Admitting: Gastroenterology

## 2021-03-21 LAB — SURGICAL PATHOLOGY

## 2021-03-22 ENCOUNTER — Encounter: Payer: Self-pay | Admitting: Gastroenterology

## 2021-04-11 ENCOUNTER — Ambulatory Visit: Payer: Medicare Other | Admitting: Internal Medicine

## 2021-04-18 ENCOUNTER — Telehealth: Payer: Self-pay | Admitting: Internal Medicine

## 2021-04-18 NOTE — Telephone Encounter (Signed)
While scheduling AWV, daughter Ivin Booty asked if you have received reports from Dr. Melrose Nakayama. I advised Ivin Booty I would send a message and a clinical staff member would call her back in regards to this.

## 2021-04-20 ENCOUNTER — Ambulatory Visit: Payer: Medicare Other

## 2021-06-20 ENCOUNTER — Telehealth: Payer: Self-pay | Admitting: Internal Medicine

## 2021-06-20 NOTE — Telephone Encounter (Signed)
Copied from [facility] [id number]. Topic: Medicare AWV >> Jun 20, 2021 11:35 AM Lavonia Drafts wrote: Reason for CRM:  N/A unable to leave a message for patient to call back and schedule the Medicare Annual Wellness Visit (AWV) virtually or by telephone.  Last AWV 06/15/18  Please schedule at anytime with [facility]Nurse Health Advisor.  45 minute appointment  Any questions, please call me at [phone]

## 2021-10-05 ENCOUNTER — Telehealth: Payer: Self-pay | Admitting: Internal Medicine

## 2021-10-05 NOTE — Telephone Encounter (Signed)
Copied from [place] [id number]. Topic: Medicare AWV >> Oct 05, 2021  3:49 PM Lavonia Drafts wrote: Reason for CRM:  N/A unable to leave a message for patient to call back and schedule the Medicare Annual Wellness Visit (AWV) virtually or by telephone.  Last AWV 06/16/19  Please schedule at anytime with [facility]-Nurse Health Advisor.   Any questions, please call me at [phone]

## 2022-02-24 ENCOUNTER — Other Ambulatory Visit: Payer: Self-pay | Admitting: Internal Medicine

## 2022-02-24 ENCOUNTER — Ambulatory Visit
Admission: RE | Admit: 2022-02-24 | Discharge: 2022-02-24 | Disposition: A | Discharge status: Home / Self Care | Payer: Medicare HMO | Source: Ambulatory Visit | Attending: Internal Medicine | Admitting: Internal Medicine

## 2022-02-24 DIAGNOSIS — Z1231 Encounter for screening mammogram for malignant neoplasm of breast: Secondary | ICD-10-CM
